# Patient Record
Sex: Female | Born: 1990 | Race: White | Hispanic: No | Marital: Single | Smoking: Never smoker
Health system: Southern US, Community
[De-identification: ages and names within clinical notes are randomized; demographics above are authoritative.]

## PROBLEM LIST (undated history)

## (undated) DIAGNOSIS — J45909 Unspecified asthma, uncomplicated: Secondary | ICD-10-CM

---

## 2012-02-14 ENCOUNTER — Inpatient Hospital Stay (HOSPITAL_COMMUNITY): Payer: BC Managed Care – PPO

## 2012-02-14 ENCOUNTER — Emergency Department (HOSPITAL_COMMUNITY): Payer: BC Managed Care – PPO

## 2012-02-14 ENCOUNTER — Encounter (HOSPITAL_COMMUNITY): Payer: Self-pay

## 2012-02-14 ENCOUNTER — Inpatient Hospital Stay (HOSPITAL_COMMUNITY)
Admission: EM | Admit: 2012-02-14 | Discharge: 2012-02-23 | DRG: 882 | Disposition: A | Discharge status: Home / Self Care | Payer: BC Managed Care – PPO | Attending: Emergency Medicine | Admitting: Emergency Medicine

## 2012-02-14 DIAGNOSIS — H5702 Anisocoria: Secondary | ICD-10-CM | POA: Diagnosis present

## 2012-02-14 DIAGNOSIS — T380X5A Adverse effect of glucocorticoids and synthetic analogues, initial encounter: Secondary | ICD-10-CM | POA: Diagnosis present

## 2012-02-14 DIAGNOSIS — J9600 Acute respiratory failure, unspecified whether with hypoxia or hypercapnia: Secondary | ICD-10-CM | POA: Diagnosis present

## 2012-02-14 DIAGNOSIS — R7309 Other abnormal glucose: Secondary | ICD-10-CM | POA: Diagnosis present

## 2012-02-14 DIAGNOSIS — J9383 Other pneumothorax: Secondary | ICD-10-CM

## 2012-02-14 DIAGNOSIS — T17408A Unspecified foreign body in trachea causing other injury, initial encounter: Secondary | ICD-10-CM | POA: Diagnosis present

## 2012-02-14 DIAGNOSIS — I959 Hypotension, unspecified: Secondary | ICD-10-CM | POA: Diagnosis present

## 2012-02-14 DIAGNOSIS — G936 Cerebral edema: Secondary | ICD-10-CM | POA: Diagnosis present

## 2012-02-14 DIAGNOSIS — G9340 Encephalopathy, unspecified: Secondary | ICD-10-CM | POA: Diagnosis present

## 2012-02-14 DIAGNOSIS — J9602 Acute respiratory failure with hypercapnia: Secondary | ICD-10-CM | POA: Diagnosis present

## 2012-02-14 DIAGNOSIS — IMO0002 Reserved for concepts with insufficient information to code with codable children: Secondary | ICD-10-CM | POA: Diagnosis present

## 2012-02-14 DIAGNOSIS — J930 Spontaneous tension pneumothorax: Secondary | ICD-10-CM | POA: Diagnosis present

## 2012-02-14 DIAGNOSIS — J811 Chronic pulmonary edema: Secondary | ICD-10-CM | POA: Diagnosis present

## 2012-02-14 DIAGNOSIS — F410 Panic disorder [episodic paroxysmal anxiety] without agoraphobia: Secondary | ICD-10-CM | POA: Diagnosis present

## 2012-02-14 DIAGNOSIS — J45902 Unspecified asthma with status asthmaticus: Principal | ICD-10-CM | POA: Diagnosis present

## 2012-02-14 DIAGNOSIS — G9349 Other encephalopathy: Secondary | ICD-10-CM | POA: Diagnosis present

## 2012-02-14 DIAGNOSIS — R739 Hyperglycemia, unspecified: Secondary | ICD-10-CM | POA: Diagnosis present

## 2012-02-14 DIAGNOSIS — E875 Hyperkalemia: Secondary | ICD-10-CM | POA: Diagnosis not present

## 2012-02-14 HISTORY — DX: Unspecified asthma, uncomplicated: J45.909

## 2012-02-14 LAB — URINALYSIS, ROUTINE W REFLEX MICROSCOPIC
Bilirubin Urine: NEGATIVE
Glucose, UA: 250 mg/dL — AB
Ketones, ur: NEGATIVE mg/dL
Specific Gravity, Urine: 1.021 (ref 1.005–1.030)
pH: 5 (ref 5.0–8.0)

## 2012-02-14 LAB — RAPID URINE DRUG SCREEN, HOSP PERFORMED
Amphetamines: NOT DETECTED
Barbiturates: NOT DETECTED
Benzodiazepines: POSITIVE — AB
Cocaine: NOT DETECTED

## 2012-02-14 LAB — BASIC METABOLIC PANEL
BUN: 11 mg/dL (ref 6–23)
BUN: 12 mg/dL (ref 6–23)
CO2: 24 mEq/L (ref 19–32)
CO2: 34 mEq/L — ABNORMAL HIGH (ref 19–32)
Calcium: 9.4 mg/dL (ref 8.4–10.5)
Calcium: 8.3 mg/dL — ABNORMAL LOW (ref 8.4–10.5)
Chloride: 109 mEq/L (ref 96–112)
Creatinine, Ser: 0.7 mg/dL (ref 0.50–1.10)
Creatinine, Ser: 1.18 mg/dL — ABNORMAL HIGH (ref 0.50–1.10)
Creatinine, Ser: 1.35 mg/dL — ABNORMAL HIGH (ref 0.50–1.10)
GFR calc Af Amer: 76 mL/min — ABNORMAL LOW (ref >90)
GFR calc Af Amer: 65 mL/min — ABNORMAL LOW (ref >90)
GFR calc non Af Amer: >90 mL/min (ref >90)
GFR calc non Af Amer: 56 mL/min — ABNORMAL LOW (ref >90)
Glucose, Bld: 215 mg/dL — ABNORMAL HIGH (ref 70–99)
Glucose, Bld: 261 mg/dL — ABNORMAL HIGH (ref 70–99)
Sodium: 139 mEq/L (ref 135–145)
Sodium: 143 mEq/L (ref 135–145)

## 2012-02-14 LAB — CBC WITH DIFFERENTIAL/PLATELET
Basophils Absolute: 0.2 10*3/uL — ABNORMAL HIGH (ref 0.0–0.1)
Basophils Relative: 1 % (ref 0–1)
Eosinophils Absolute: 0.9 10*3/uL — ABNORMAL HIGH (ref 0.0–0.7)
Hemoglobin: 13.9 g/dL (ref 12.0–15.0)
Lymphocytes Relative: 33 % (ref 12–46)
MCH: 28.7 pg (ref 26.0–34.0)
MCHC: 33.5 g/dL (ref 30.0–36.0)
Monocytes Absolute: 1.6 10*3/uL — ABNORMAL HIGH (ref 0.1–1.0)
Neutro Abs: 12.4 10*3/uL — ABNORMAL HIGH (ref 1.7–7.7)
Neutrophils Relative %: 55 % (ref 43–77)
RDW: 12.8 % (ref 11.5–15.5)

## 2012-02-14 LAB — GLUCOSE, CAPILLARY
Glucose-Capillary: 143 mg/dL — ABNORMAL HIGH (ref 70–99)
Glucose-Capillary: 106 mg/dL — ABNORMAL HIGH (ref 70–99)

## 2012-02-14 LAB — BLOOD GAS, ARTERIAL
Acid-base deficit: 2.1 mmol/L — ABNORMAL HIGH (ref 0.0–2.0)
Bicarbonate: 27.8 mEq/L — ABNORMAL HIGH (ref 20.0–24.0)
FIO2: 0.7 %
O2 Saturation: 97.3 %
PEEP: 8 cmH2O
TCO2: 25.5 mmol/L (ref 0–100)
pCO2 arterial: 72.1 mmHg (ref 35.0–45.0)
pO2, Arterial: 84 mmHg (ref 80.0–100.0)

## 2012-02-14 LAB — URINE MICROSCOPIC-ADD ON

## 2012-02-14 LAB — PRO B NATRIURETIC PEPTIDE: Pro B Natriuretic peptide (BNP): 40.4 pg/mL (ref 0–125)

## 2012-02-14 LAB — PREGNANCY, URINE: Preg Test, Ur: NEGATIVE

## 2012-02-14 MED ORDER — INSULIN ASPART 100 UNIT/ML ~~LOC~~ SOLN
0.0000 [IU] | SUBCUTANEOUS | Status: DC
  Administered 2012-02-14 – 2012-02-15 (×5): 3 [IU] via SUBCUTANEOUS

## 2012-02-14 MED ORDER — INSULIN ASPART 100 UNIT/ML ~~LOC~~ SOLN
10.0000 [IU] | Freq: Once | SUBCUTANEOUS | Status: DC
  Filled 2012-02-14: qty 3

## 2012-02-14 MED ORDER — SODIUM CHLORIDE 0.9 % IV BOLUS (SEPSIS)
1000.0000 mL | Freq: Once | INTRAVENOUS | Status: AC
  Administered 2012-02-14: 1000 mL via INTRAVENOUS

## 2012-02-14 MED ORDER — LORAZEPAM 2 MG/ML IJ SOLN
4.0000 mg | Freq: Once | INTRAMUSCULAR | Status: AC
  Administered 2012-02-14: 4 mg via INTRAVENOUS
  Filled 2012-02-14: qty 2

## 2012-02-14 MED ORDER — PROPOFOL 10 MG/ML IV EMUL
5.0000 ug/kg/min | INTRAVENOUS | Status: DC
  Administered 2012-02-14: 35 ug/kg/min via INTRAVENOUS
  Administered 2012-02-15 (×4): 40 ug/kg/min via INTRAVENOUS
  Administered 2012-02-16: 35 ug/kg/min via INTRAVENOUS
  Administered 2012-02-16 (×2): 40 ug/kg/min via INTRAVENOUS
  Administered 2012-02-16: 35 ug/kg/min via INTRAVENOUS
  Administered 2012-02-17: 45 ug/kg/min via INTRAVENOUS
  Administered 2012-02-17: 35 ug/kg/min via INTRAVENOUS
  Administered 2012-02-17 (×2): 45 ug/kg/min via INTRAVENOUS
  Administered 2012-02-17: 35 ug/kg/min via INTRAVENOUS
  Filled 2012-02-14 (×22): qty 100

## 2012-02-14 MED ORDER — ALBUTEROL SULFATE (5 MG/ML) 0.5% IN NEBU
INHALATION_SOLUTION | RESPIRATORY_TRACT | Status: AC
  Filled 2012-02-14: qty 2

## 2012-02-14 MED ORDER — ROCURONIUM BROMIDE 50 MG/5ML IV SOLN
INTRAVENOUS | Status: AC
  Filled 2012-02-14: qty 2

## 2012-02-14 MED ORDER — LORAZEPAM 2 MG/ML IJ SOLN
4.0000 mg | Freq: Once | INTRAMUSCULAR | Status: AC
  Administered 2012-02-14: 4 mg via INTRAVENOUS

## 2012-02-14 MED ORDER — ARTIFICIAL TEARS OP OINT
TOPICAL_OINTMENT | Freq: Three times a day (TID) | OPHTHALMIC | Status: DC
  Administered 2012-02-15 – 2012-02-16 (×5): via OPHTHALMIC
  Filled 2012-02-14: qty 3.5

## 2012-02-14 MED ORDER — ETOMIDATE 2 MG/ML IV SOLN
INTRAVENOUS | Status: AC
  Administered 2012-02-14: 20 mg via INTRAVENOUS
  Filled 2012-02-14: qty 20

## 2012-02-14 MED ORDER — PROPOFOL 10 MG/ML IV BOLUS
INTRAVENOUS | Status: AC
  Filled 2012-02-14: qty 1

## 2012-02-14 MED ORDER — DEXTROSE 5 % IV SOLN
1.0000 g | Freq: Once | INTRAVENOUS | Status: AC
  Administered 2012-02-14: 1 g via INTRAVENOUS
  Filled 2012-02-14: qty 10

## 2012-02-14 MED ORDER — BIOTENE DRY MOUTH MT LIQD
15.0000 mL | Freq: Four times a day (QID) | OROMUCOSAL | Status: DC
  Administered 2012-02-15 – 2012-02-19 (×19): 15 mL via OROMUCOSAL

## 2012-02-14 MED ORDER — NOREPINEPHRINE BITARTRATE 1 MG/ML IJ SOLN
2.0000 ug/min | INTRAVENOUS | Status: DC
  Administered 2012-02-14: 5 ug/min via INTRAVENOUS
  Filled 2012-02-14: qty 4

## 2012-02-14 MED ORDER — IPRATROPIUM BROMIDE HFA 17 MCG/ACT IN AERS
6.0000 | INHALATION_SPRAY | Freq: Four times a day (QID) | RESPIRATORY_TRACT | Status: DC | PRN
  Administered 2012-02-14: 6 via RESPIRATORY_TRACT
  Filled 2012-02-14: qty 12.9

## 2012-02-14 MED ORDER — PHENYLEPHRINE HCL 10 MG/ML IJ SOLN
30.0000 ug/min | INTRAMUSCULAR | Status: DC
  Administered 2012-02-14: 20 ug/min via INTRAVENOUS
  Filled 2012-02-14: qty 1

## 2012-02-14 MED ORDER — FLUTICASONE PROPIONATE HFA 220 MCG/ACT IN AERO
2.0000 | INHALATION_SPRAY | Freq: Two times a day (BID) | RESPIRATORY_TRACT | Status: DC
  Administered 2012-02-14 – 2012-02-15 (×2): 2 via RESPIRATORY_TRACT
  Filled 2012-02-14: qty 12

## 2012-02-14 MED ORDER — DOXYCYCLINE HYCLATE 100 MG IV SOLR
100.0000 mg | Freq: Once | INTRAVENOUS | Status: AC
  Administered 2012-02-14: 100 mg via INTRAVENOUS
  Filled 2012-02-14: qty 100

## 2012-02-14 MED ORDER — DEXTROSE 50 % IV SOLN
INTRAVENOUS | Status: AC
  Administered 2012-02-14: 50 mL
  Filled 2012-02-14: qty 50

## 2012-02-14 MED ORDER — TERBUTALINE SULFATE 1 MG/ML IJ SOLN
0.2500 mg | Freq: Once | INTRAMUSCULAR | Status: AC
  Administered 2012-02-14: 0.25 mg via SUBCUTANEOUS
  Filled 2012-02-14: qty 1

## 2012-02-14 MED ORDER — INSULIN ASPART 100 UNIT/ML IV SOLN
10.0000 [IU] | Freq: Once | INTRAVENOUS | Status: AC
  Administered 2012-02-14: 10 [IU] via INTRAVENOUS

## 2012-02-14 MED ORDER — MAGNESIUM SULFATE 40 MG/ML IJ SOLN
2.0000 g | Freq: Once | INTRAMUSCULAR | Status: AC
  Administered 2012-02-14: 2 g via INTRAVENOUS
  Filled 2012-02-14: qty 50

## 2012-02-14 MED ORDER — FENTANYL CITRATE 0.05 MG/ML IJ SOLN
25.0000 ug | Freq: Once | INTRAMUSCULAR | Status: AC
  Administered 2012-02-14 (×2): 25 ug via INTRAVENOUS

## 2012-02-14 MED ORDER — SODIUM CHLORIDE 0.9 % IV SOLN
100.0000 ug/h | INTRAVENOUS | Status: DC
  Administered 2012-02-14: 50 ug/h via INTRAVENOUS
  Administered 2012-02-16: 100 ug/h via INTRAVENOUS
  Filled 2012-02-14 (×3): qty 50

## 2012-02-14 MED ORDER — LEVALBUTEROL TARTRATE 45 MCG/ACT IN AERO
4.0000 | INHALATION_SPRAY | RESPIRATORY_TRACT | Status: DC | PRN
  Administered 2012-02-14 (×3): 4 via RESPIRATORY_TRACT
  Filled 2012-02-14: qty 15

## 2012-02-14 MED ORDER — SODIUM CHLORIDE 0.9 % IV SOLN
INTRAVENOUS | Status: AC
  Filled 2012-02-14: qty 10

## 2012-02-14 MED ORDER — LIDOCAINE HCL (CARDIAC) 20 MG/ML IV SOLN
INTRAVENOUS | Status: AC
  Filled 2012-02-14: qty 5

## 2012-02-14 MED ORDER — BUDESONIDE 0.25 MG/2ML IN SUSP
0.2500 mg | Freq: Four times a day (QID) | RESPIRATORY_TRACT | Status: DC
  Filled 2012-02-14 (×4): qty 2

## 2012-02-14 MED ORDER — SUCCINYLCHOLINE CHLORIDE 20 MG/ML IJ SOLN
INTRAMUSCULAR | Status: AC
  Administered 2012-02-14: 100 mg via INTRAVENOUS
  Filled 2012-02-14: qty 5

## 2012-02-14 MED ORDER — FENTANYL CITRATE 0.05 MG/ML IJ SOLN
INTRAMUSCULAR | Status: AC
  Administered 2012-02-14: 25 ug via INTRAVENOUS
  Filled 2012-02-14: qty 2

## 2012-02-14 MED ORDER — SODIUM CHLORIDE 3 % IV SOLN
INTRAVENOUS | Status: DC
  Administered 2012-02-14 – 2012-02-15 (×3): via INTRAVENOUS
  Filled 2012-02-14 (×4): qty 500

## 2012-02-14 MED ORDER — ALBUTEROL (5 MG/ML) CONTINUOUS INHALATION SOLN
10.0000 mg/h | INHALATION_SOLUTION | Freq: Once | RESPIRATORY_TRACT | Status: AC
  Administered 2012-02-14: 10 mg/h via RESPIRATORY_TRACT

## 2012-02-14 MED ORDER — SODIUM POLYSTYRENE SULFONATE 15 GM/60ML PO SUSP
30.0000 g | Freq: Once | ORAL | Status: AC
  Administered 2012-02-14: 30 g
  Filled 2012-02-14: qty 120

## 2012-02-14 MED ORDER — FAMOTIDINE IN NACL 20-0.9 MG/50ML-% IV SOLN
20.0000 mg | Freq: Two times a day (BID) | INTRAVENOUS | Status: DC
  Administered 2012-02-14 – 2012-02-18 (×9): 20 mg via INTRAVENOUS
  Filled 2012-02-14 (×10): qty 50

## 2012-02-14 MED ORDER — IPRATROPIUM BROMIDE 0.02 % IN SOLN
0.5000 mg | Freq: Once | RESPIRATORY_TRACT | Status: AC
  Administered 2012-02-14: 0.5 mg via RESPIRATORY_TRACT

## 2012-02-14 MED ORDER — BUDESONIDE 0.25 MG/2ML IN SUSP
0.2500 mg | Freq: Four times a day (QID) | RESPIRATORY_TRACT | Status: DC
  Administered 2012-02-14: 0.25 mg via RESPIRATORY_TRACT
  Filled 2012-02-14 (×4): qty 2

## 2012-02-14 MED ORDER — DEXTROSE 50 % IV SOLN
50.0000 mL | Freq: Once | INTRAVENOUS | Status: AC
  Administered 2012-02-14: 50 mL via INTRAVENOUS
  Filled 2012-02-14: qty 50

## 2012-02-14 MED ORDER — SODIUM CHLORIDE 0.9 % IV SOLN
250.0000 mL | INTRAVENOUS | Status: DC | PRN
  Administered 2012-02-18 – 2012-02-22 (×3): 250 mL via INTRAVENOUS

## 2012-02-14 MED ORDER — METHYLPREDNISOLONE SODIUM SUCC 125 MG IJ SOLR
125.0000 mg | Freq: Four times a day (QID) | INTRAMUSCULAR | Status: DC
  Administered 2012-02-14 – 2012-02-16 (×7): 125 mg via INTRAVENOUS
  Filled 2012-02-14 (×12): qty 2

## 2012-02-14 MED ORDER — CHLORHEXIDINE GLUCONATE 0.12 % MT SOLN
OROMUCOSAL | Status: AC
  Administered 2012-02-14: 15 mL via OROMUCOSAL
  Filled 2012-02-14: qty 15

## 2012-02-14 MED ORDER — SODIUM BICARBONATE 8.4 % IV SOLN
INTRAVENOUS | Status: AC
  Administered 2012-02-14: 50 meq
  Filled 2012-02-14: qty 50

## 2012-02-14 MED ORDER — KETAMINE HCL 10 MG/ML IJ SOLN: 1.5000 mg/kg | Freq: Once | INTRAMUSCULAR | Status: DC

## 2012-02-14 MED ORDER — KETAMINE HCL 10 MG/ML IJ SOLN
40.0000 mg | Freq: Once | INTRAMUSCULAR | Status: AC
  Administered 2012-02-14: 40 mg via INTRAVENOUS

## 2012-02-14 MED ORDER — CHLORHEXIDINE GLUCONATE 0.12 % MT SOLN
15.0000 mL | Freq: Two times a day (BID) | OROMUCOSAL | Status: DC
  Administered 2012-02-14 – 2012-02-19 (×10): 15 mL via OROMUCOSAL
  Filled 2012-02-14 (×10): qty 15

## 2012-02-14 MED ORDER — SODIUM CHLORIDE 0.9 % IV SOLN
2.0000 mg/h | INTRAVENOUS | Status: DC
  Administered 2012-02-14: 2 mg/h via INTRAVENOUS
  Filled 2012-02-14: qty 10

## 2012-02-14 MED ORDER — SODIUM BICARBONATE 8.4 % IV SOLN
INTRAVENOUS | Status: DC
  Administered 2012-02-14 (×2): via INTRAVENOUS
  Filled 2012-02-14 (×5): qty 150

## 2012-02-14 MED ORDER — LEVALBUTEROL TARTRATE 45 MCG/ACT IN AERO: 8.0000 | INHALATION_SPRAY | Freq: Four times a day (QID) | RESPIRATORY_TRACT | Status: DC

## 2012-02-14 MED ORDER — LEVALBUTEROL HCL 1.25 MG/0.5ML IN NEBU
1.2500 mg | INHALATION_SOLUTION | Freq: Four times a day (QID) | RESPIRATORY_TRACT | Status: DC
  Filled 2012-02-14 (×3): qty 6
  Filled 2012-02-14: qty 0.5
  Filled 2012-02-14: qty 6

## 2012-02-14 MED ORDER — IPRATROPIUM BROMIDE HFA 17 MCG/ACT IN AERS: 4.0000 | INHALATION_SPRAY | Freq: Four times a day (QID) | RESPIRATORY_TRACT | Status: DC

## 2012-02-14 MED ORDER — ALBUTEROL SULFATE (5 MG/ML) 0.5% IN NEBU
2.5000 mg | INHALATION_SOLUTION | Freq: Once | RESPIRATORY_TRACT | Status: AC
  Administered 2012-02-14: 2.5 mg via RESPIRATORY_TRACT

## 2012-02-14 MED ORDER — IPRATROPIUM BROMIDE 0.02 % IN SOLN
0.5000 mg | Freq: Four times a day (QID) | RESPIRATORY_TRACT | Status: DC
  Administered 2012-02-14: 0.5 mg via RESPIRATORY_TRACT
  Filled 2012-02-14: qty 2.5

## 2012-02-14 MED ORDER — KCL IN DEXTROSE-NACL 20-5-0.45 MEQ/L-%-% IV SOLN: INTRAVENOUS | Status: DC

## 2012-02-14 MED ORDER — SODIUM CHLORIDE 0.9 % IV SOLN
0.5000 ug/kg/min | INTRAVENOUS | Status: DC
  Administered 2012-02-14: 2.8 ug/kg/min via INTRAVENOUS
  Administered 2012-02-16: 2 ug/kg/min via INTRAVENOUS
  Filled 2012-02-14 (×3): qty 20

## 2012-02-14 MED ORDER — LORAZEPAM 2 MG/ML IJ SOLN
INTRAMUSCULAR | Status: AC
  Administered 2012-02-14: 4 mg via INTRAVENOUS
  Filled 2012-02-14: qty 2

## 2012-02-14 MED ORDER — LEVALBUTEROL TARTRATE 45 MCG/ACT IN AERO
8.0000 | INHALATION_SPRAY | RESPIRATORY_TRACT | Status: DC
  Administered 2012-02-14 – 2012-02-18 (×43): 8 via RESPIRATORY_TRACT
  Filled 2012-02-14 (×2): qty 15

## 2012-02-14 MED ORDER — SODIUM CHLORIDE 0.9 % IV SOLN
1.0000 g | Freq: Once | INTRAVENOUS | Status: AC
  Administered 2012-02-14: 1 g via INTRAVENOUS
  Filled 2012-02-14: qty 10

## 2012-02-14 MED ORDER — HEPARIN SODIUM (PORCINE) 5000 UNIT/ML IJ SOLN
5000.0000 [IU] | Freq: Three times a day (TID) | INTRAMUSCULAR | Status: DC
  Administered 2012-02-14 – 2012-02-23 (×27): 5000 [IU] via SUBCUTANEOUS
  Filled 2012-02-14 (×30): qty 1

## 2012-02-14 MED ORDER — IPRATROPIUM BROMIDE HFA 17 MCG/ACT IN AERS
6.0000 | INHALATION_SPRAY | RESPIRATORY_TRACT | Status: DC | PRN
  Administered 2012-02-14: 6 via RESPIRATORY_TRACT
  Filled 2012-02-14: qty 12.9

## 2012-02-14 MED ORDER — LEVALBUTEROL HCL 0.63 MG/3ML IN NEBU
0.6300 mg | INHALATION_SOLUTION | RESPIRATORY_TRACT | Status: DC | PRN
  Filled 2012-02-14 (×2): qty 3

## 2012-02-14 MED ORDER — SODIUM CHLORIDE 0.9 % IV SOLN
Freq: Once | INTRAVENOUS | Status: AC
  Administered 2012-02-14: 09:00:00 via INTRAVENOUS

## 2012-02-14 MED FILL — Medication: Qty: 1 | Status: AC

## 2012-02-14 NOTE — ED Notes (Signed)
Per EMS pt c/o asthma attack, pt from out of town, used inhaler but doesn't have her normal meds, pt received 2 a/a tx enroute, pt went unresponsive enroute, ems attempted to intubate with no success, pt had gag reflex. EDP at bedside

## 2012-02-14 NOTE — ED Notes (Signed)
Report given-transfer to 1233

## 2012-02-14 NOTE — Procedures (Signed)
Chest Tube Insertion Procedure Note  Indications:  Clinically significant Pneumothorax  Pre-operative Diagnosis: Pneumothorax  Post-operative Diagnosis: Pneumothorax  Procedure Details  Informed consent was obtained for the procedure, including sedation.  Risks of lung perforation, hemorrhage, arrhythmia, and adverse drug reaction were discussed.   After sterile skin prep, using standard technique, a 28 French tube was placed in the left anterior 4th  rib space.  Findings: Rush of air emitted  Estimated Blood Loss:  Minimal         Specimens:  None              Complications:  None; patient tolerated the procedure well.         Disposition: ICU - intubated and critically ill.         Condition: unstable  Attending Attestation: I performed the procedure.  Dorcas Carrow Beeper  [phone]  Cell  [phone]  If no response or cell goes to voicemail, call beeper [phone]

## 2012-02-14 NOTE — Progress Notes (Signed)
[id number]/Chrishawn Kring, RN, BSN, CCM: CHART REVIEWED AND UPDATED. Next chart review due on 1207013. NO DISCHARGE NEEDS PRESENT AT THIS TIME. CASE MANAGEMENT [phone] 

## 2012-02-14 NOTE — Progress Notes (Signed)
eLink Physician-Brief Progress Note Patient Name: Nyiesha Beever DOB: 01/17/1991 MRN: [id number]  Date of Service  02/14/2012   HPI/Events of Note   Vt at 600cc, PCV with PC 60 and PEEP 5  eICU Interventions  ABG now with goal to decrease PC if acidosis will allow.    Intervention Category Major Interventions: Respiratory failure - evaluation and management  Yania Bogie S. 02/14/2012, 11:15 PM

## 2012-02-14 NOTE — ED Notes (Signed)
Pt intubated successfully 7.0 @ 22 by Dr. Rhunette Croft at [phone], Respiratory at bedside, confirmed by CO2 detector. Portable cxray done.

## 2012-02-14 NOTE — ED Provider Notes (Addendum)
History     CSN: [id number]  Arrival date & time 02/14/12  4098   First MD Initiated Contact with Patient 02/14/12 0809      Chief Complaint  Patient presents with  . Respiratory Distress    (Consider location/radiation/quality/duration/timing/severity/associated sxs/prior treatment) HPI Comments: Level 5 caveat - unstable vital signs, altered mental status.  Patient with hx of asthma comes in with cc of respiratory distress. Per EMS, patient was given solumedrol, albuterol en route, and she became unconscious. Patient is a Consulting civil engineer in [employer], and she knocked on the door of one of her friends complaining of DIB. I spoke with mother recently, and she reports that patient has a severe asthma, and that she has been intubated once before 2 years back.  Pt arrives with a GCS (e/v/m = 4, 3, 6) 13. She was slightly combative and diaphoretic, with pupils at 4 mm We started bipap, given terbutaline, but she stopped following commands, and ABG showed severe acidosis - so decision to intubate was made.  The history is provided by a relative, a friend and the EMS personnel.    Past Medical History  Diagnosis Date  . Asthma     History reviewed. No pertinent past surgical history.  No family history on file.  History  Substance Use Topics  . Smoking status: Not on file  . Smokeless tobacco: Not on file  . Alcohol Use: No    OB History    Grav Para Term Preterm Abortions TAB SAB Ect Mult Living                  Review of Systems  Unable to perform ROS: Unstable vital signs    Allergies  Azithromycin  Home Medications  No current outpatient prescriptions on file.  BP 190/110  Pulse 55  Resp 27  Wt 209 lb (94.802 kg)  SpO2 67%  Physical Exam  Nursing note and vitals reviewed. Constitutional: She appears well-developed.  HENT:  Head: Normocephalic.  Eyes: Conjunctivae normal are normal. Pupils are equal, round, and reactive to light.  Cardiovascular: Regular rhythm.         tachycardia  Pulmonary/Chest: No stridor. She is in respiratory distress. She has wheezes.       Pt has respiratory distress, breathing over 30, unable to complete full sentences, and using accessory muscles. Pt also noted to have abd retractions with diffuse wheezing and poor air entry.  Abdominal: Soft.  Musculoskeletal: She exhibits no edema.  Neurological:       gcs - 13 initially, dropped to 4/1/1 = 6   Skin: Skin is warm.       diophoretic    ED Course  OG placement Date/Time: 02/14/2012 9:00 AM Performed by: Derwood Kaplan Authorized by: Derwood Kaplan Consent: Verbal consent not obtained. The procedure was performed in an emergent situation. Local anesthesia used: no Patient sedated: no Patient tolerance: Patient tolerated the procedure well with no immediate complications.   (including critical care time)   Labs Reviewed  BLOOD GAS, ARTERIAL  URINALYSIS, ROUTINE W REFLEX MICROSCOPIC  URINE RAPID DRUG SCREEN (HOSP PERFORMED)  PREGNANCY, URINE  CBC WITH DIFFERENTIAL  BASIC METABOLIC PANEL  TROPONIN I   Dg Chest Portable 1 View  02/14/2012  *RADIOLOGY REPORT*  Clinical Data: Respiratory distress, altered level of consciousness, intubation  PORTABLE CHEST - 1 VIEW  Comparison: Portable exam 0809 hours without priors for comparison.  Findings: Tip of endotracheal tube 2.5 cm above carina. Nasogastric tube coiled in stomach.  Upper normal heart size. Normal mediastinal contours and pulmonary vascularity. Bilateral upper lobe volume loss and slight superior retraction of the hila. Abnormal density in the medial left upper lobe question atelectasis versus infiltrate. Vague opacity at medial right apex. Remaining lungs well expanded and clear. No pleural effusion or pneumothorax.  IMPRESSION: Satisfactory endotracheal tube position. Abnormal left upper lobe and right apical densities, question atelectasis versus infiltrate in the left upper lobe. Right apical changes could  potentially represent scarring, but underlying mass lesions in the upper lobes are not excluded. Radiographic follow-up until resolution recommended. If these changes fail to resolve, CT chest with contrast would be warranted for further evaluation.   Original Report Authenticated By: Ulyses Southward, M.D.      No diagnosis found.    MDM   Date: 02/14/2012  Rate: 74  Rhythm: normal sinus rhythm  QRS Axis: normal  Intervals: normal  ST/T Wave abnormalities: nonspecific T wave changes  Conduction Disutrbances:none  Narrative Interpretation:   Old EKG Reviewed: none available  Pt comes in respiratory failure. Hx of asthma per friend. Started on bipap immediately, and terb given. Exam consistent with asthma/copd exacerbation.  Pt's mentation declined despite being started on bipap, and we intubated her. Pt's friend informed. Spoke with the mother over the phone. Mother reports previous intubation 2 years back - likely from asthma related problems. CCM called.  INTUBATION Performed by: Derwood Kaplan  Required items: required blood products, implants, devices, and special equipment available Patient identity confirmed: provided demographic data and hospital-assigned identification number Time out: Immediately prior to procedure a "time out" was called to verify the correct patient, procedure, equipment, support staff and site/side marked as required.  Indications: Respiratory failure  Intubation method: Direct Laryngoscopy   Preoxygenation: Bipap  Sedatives: Etomidate 20 mg Paralytic: Succinylcholine 100 mg  Tube Size: 7 cuffed  Post-procedure assessment: chest rise and ETCO2 monitor Breath sounds: equal and absent over the epigastrium Tube secured with: ETT holder Chest x-ray interpreted by radiologist and me.  Chest x-ray findings: endotracheal tube in appropriate position  Patient tolerated the procedure well with no immediate complications.  CRITICAL CARE Performed  by: Derwood Kaplan   Total critical care time: 45 minutes  Critical care time was exclusive of separately billable procedures and treating other patients.  Critical care was necessary to treat or prevent imminent or life-threatening deterioration.  Critical care was time spent personally by me on the following activities: development of treatment plan with patient and/or surrogate as well as nursing, discussions with consultants, evaluation of patient's response to treatment, examination of patient, obtaining history from patient or surrogate, ordering and performing treatments and interventions, ordering and review of laboratory studies, ordering and review of radiographic studies, pulse oximetry and re-evaluation of patient's condition.   Derwood Kaplan, MD 02/14/12 1610   Derwood Kaplan, MD 02/14/12 [phone]

## 2012-02-14 NOTE — Progress Notes (Signed)
Chaplain met with pt's mother and family at bedside.  Introduced spiritual care as resource and provided support around pt's hospitalization and empathic presence.   At time of chaplain encounter, pt's mother had not yet spoken with critical care and was anxious to receive information about pt's condition.    Belva Crome  MDiv, Chaplain

## 2012-02-14 NOTE — Procedures (Signed)
PROCEDURE NOTE: [place] CVL PLACEMENT  INDICATION:    Monitoring of central venous pressures and/or administration of medications optimally administered in central vein  CONSENT:   Risks of procedure as well as the alternatives were explained to the patient or surrogate. Consent for procedure obtained. A time out was performed to review patient identification, procedure to be performed, correct patient position, medications/allergies/relevent history, required imaging and test results.  PROCEDURE  Maximum sterile technique was used including antiseptics, cap, gloves, gown, hand hygiene, mask and sheet.  Skin prep: Chlorhexidine; local anesthetic administered  A antimicrobial bonded/coated triple lumen catheter was placed in the R [place] vein using the Seldinger technique.    EVALUATION:  Blood flow good  Complications: No apparent complications  Patient tolerated the procedure well.  Chest X-ray confirmed proper position and no PTX   Billy Fischer, MD [facility] [phone]

## 2012-02-14 NOTE — ED Notes (Signed)
CBG 197. 

## 2012-02-14 NOTE — Procedures (Signed)
Chest Tube Insertion Procedure Note  Indications:  Clinically significant Pneumothorax  Pre-operative Diagnosis: Pneumothorax  Post-operative Diagnosis: Pneumothorax  Procedure Details  Informed consent was obtained for the procedure, including sedation.  Risks of lung perforation, hemorrhage, arrhythmia, and adverse drug reaction were discussed.   After sterile skin prep, using standard technique, a 28 French tube was placed in the right anterior 4th rib space.  Findings: Rush of air emitted   Estimated Blood Loss:  less than 50 mL         Specimens:  None              Complications:  None; patient tolerated the procedure well.         Disposition: ICU - intubated and critically ill.         Condition: unstable  Attending Attestation: I performed the procedure.   Dorcas Carrow Beeper  [phone]  Cell  [phone]  If no response or cell goes to voicemail, call beeper [phone]

## 2012-02-14 NOTE — Progress Notes (Signed)
eLink Physician-Brief Progress Note Patient Name: Crystall Donaldson DOB: Nov 01, 1990 MRN: [id number]  Date of Service  02/14/2012   HPI/Events of Note    Lab 02/14/12 2321 02/14/12 2136 02/14/12 1949 02/14/12 1600 02/14/12 1110 02/14/12 0730  PHART 7.209* 7.062* 6.871* 6.812* PENDING --  PCO2ART 72.1* -- -- NO RESULTS GIVEN OUT OF REPORTABLE RANGE PENDING PENDING  PO2ART 84.0 265.0* 159.0* 98.8 123.0* --  HCO3 27.8* -- -- NO RESULTS GIVEN OUT OF REPORTABLE RANGE PENDING PENDING  TCO2 25.5 -- -- NO RESULTS GIVEN OUT OF REPORTABLE RANGE PENDING PENDING  O2SAT 97.3 99.3 98.3 93.8 93.7 --     eICU Interventions  Vent adjusted to maintain Ve > 15L/min >> PCV decreased to 50, PEEP to 5, FiO2 to 0.60. Goal decrease RR if pH trend continues to improve   Intervention Category Major Interventions: Respiratory failure - evaluation and management  Keilan Nichol S. 02/14/2012, 11:46 PM

## 2012-02-14 NOTE — Progress Notes (Signed)
PCCM:  I was called stat to the bedside at 6pm to assess this 21yo F with status asthmaticus and resp failure with bilateral PTX and unable to ventilate the pt.  CXR with progressive PTX seen.  I did the following :  1. Gave 40mg  KEtamine 2. D/c atrovent Increased xopenex RX 3. GAve NS volume, started pressors, gave Ca, N[place]3, started kayexalate 4. Placed two chest tubes into anterior apical space.  L remained at apex, R CT migrated to the base. 5. FOBperformed and thick secretions removed from both lungs   Pt changed to PCV ventilation.  K still > 7.5 ABG is pending.  Pt remains critically ill. I am very concerned re: CNS status  Still requiring high PIP to ventilate.  CC time 1.5 hours Dorcas Carrow Beeper  [phone]  Cell  [phone]  If no response or cell goes to voicemail, call beeper [phone]

## 2012-02-14 NOTE — Progress Notes (Signed)
Spoke with Marquette Saa on phone. He called my office to give me patient and family information. I took this information to the ED nurse. He is evidently a long time family friend. He told me at 9:00 the mother and her sister, the patient's aunt, left to come to [place]. It is a five hour drive. Will support family when they arrive.

## 2012-02-14 NOTE — Consult Note (Addendum)
NEURO HOSPITALIST CONSULT NOTE    Reason for Consult:Questionable intracerebral edema  HPI:                                                                                                                                         HPI obtained by chart--patient is currently being intubated.  Alexandra Thornton is an 21 y.o. female who was brought to [facility] ED for status asthmaticus.  Patient apperantly awoke this AM SOB and called a friend who then called EMS.  En route patient was given solumedrol and albuterol yet she became unconscious.Patient was brought to ED slightly combative moving all extremities spontaneously and purposefully.  Patient was started on bipap, given terbutaline, then stopped following commands, and ABG showed severe acidosis. Due to respiratory failure patient was intubated.  PCCM noted patient right pupil was dilated and non-reactive at time of exam.CXR showed 15% right pneumothorax with pneumomediastinum and diffuse subcutaneous emphysema. Head CT reading reported  poor delineation of the sulci and cisterns. Slightly poor definition of portions of the gray-white interface. Slightly low-lying right cerebellar tonsil.  Diffuse subcutaneous emphysema right neck greater on the rightt. This extends into the right orbital region.  At present time patient is on Nimbex, PCCM is placing bilateral chest tubes and initiating hypertonic saline protocol. Neurology was asked to evaluate patient for dilated pupil and evaluate for possible anoxic brain injury.     Past Medical History  Diagnosis Date  . Asthma     Family History: Not attainable at this time.   Social History:  Patient is intubated and unable to answer questions. She does not have a smoking history on file. She does not have any smokeless tobacco history on file. She reports that she does not drink alcohol. Her drug history not on file.  Allergies  Allergen Reactions  . Azithromycin     MEDICATIONS:                                                                                                                      Current Facility-Administered Medications  Medication Dose Route Frequency Provider Last Rate Last Dose  . [COMPLETED] 0.9 %  sodium chloride infusion   Intravenous Once Derwood Kaplan, MD 150 mL/hr at 02/14/12 0837    . 0.9 %  sodium chloride infusion  250 mL Intravenous PRN Merwyn Katos, MD      . [address] albuterol (PROVENTIL) (5 MG/ML) 0.5% nebulizer solution 2.5 mg  2.5 mg Nebulization Once Ankit Nanavati, MD   2.5 mg at 02/14/12 0810  . albuterol (PROVENTIL) (5 MG/ML) 0.5% nebulizer solution           . [COMPLETED] albuterol (PROVENTIL,VENTOLIN) solution continuous neb  10 mg/hr Nebulization Once Derwood Kaplan, MD   10 mg/hr at 02/14/12 0820  . budesonide (PULMICORT) nebulizer solution 0.25 mg  0.25 mg Nebulization QID Ankit Nanavati, MD   0.25 mg at 02/14/12 1017  . cefTRIAXone (ROCEPHIN) 1 g in dextrose 5 % 50 mL IVPB  1 g Intravenous Once Ankit Nanavati, MD      . cisatracurium (NIMBEX) 200 mg in sodium chloride 0.9 % 200 mL infusion  0.5-10 mcg/kg/min Intravenous Titrated Merwyn Katos, MD 45.5 mL/hr at 02/14/12 1018 8 mcg/kg/min at 02/14/12 1018  . doxycycline (VIBRAMYCIN) 100 mg in dextrose 5 % 250 mL IVPB  100 mg Intravenous Once Derwood Kaplan, MD      . [COMPLETED] etomidate (AMIDATE) 2 MG/ML injection        20 mg at 02/14/12 0750  . famotidine (PEPCID) IVPB 20 mg  20 mg Intravenous Q12H Merwyn Katos, MD      . fentaNYL (SUBLIMAZE) 10 mcg/mL in sodium chloride 0.9 % 250 mL infusion  50 mcg/hr Intravenous Continuous Merwyn Katos, MD      . D[address] fentaNYL (SUBLIMAZE) injection 25 mcg  25 mcg Intravenous Once Derwood Kaplan, MD   25 mcg at 02/14/12 0828  . heparin injection 5,000 Units  5,000 Units Subcutaneous Q8H Merwyn Katos, MD      . insulin aspart (novoLOG) injection 0-20 Units  0-20 Units Subcutaneous Q4H Merwyn Katos, MD      . [COMPLETED]  ipratropium (ATROVENT) nebulizer solution 0.5 mg  0.5 mg Nebulization Once Derwood Kaplan, MD   0.5 mg at 02/14/12 0810  . ipratropium (ATROVENT) nebulizer solution 0.5 mg  0.5 mg Nebulization Q6H Merwyn Katos, MD   0.5 mg at 02/14/12 1015  . levalbuterol (XOPENEX) nebulizer solution 0.63 mg  0.63 mg Nebulization Q3H PRN Merwyn Katos, MD      . levalbuterol [facility]) nebulizer solution 1.25 mg  1.25 mg Nebulization Q6H Merwyn Katos, MD      . lidocaine (cardiac) 100 mg/39ml (XYLOCAINE) 20 MG/ML injection 2%           . [COMPLETED] LORazepam (ATIVAN) injection 4 mg  4 mg Intravenous Once Derwood Kaplan, MD   4 mg at 02/14/12 0828  . [COMPLETED] LORazepam (ATIVAN) injection 4 mg  4 mg Intravenous Once Derwood Kaplan, MD   4 mg at 02/14/12 0802  . [COMPLETED] magnesium sulfate IVPB 2 g 50 mL  2 g Intravenous Once Derwood Kaplan, MD   2 g at 02/14/12 0825  . methylPREDNISolone sodium succinate (SOLU-MEDROL) 125 mg/2 mL injection 125 mg  125 mg Intravenous Q6H Merwyn Katos, MD      . propofol (DIPRIVAN) 10 mg/mL bolus/IV push           . propofol (DIPRIVAN) 10 mg/ml infusion  5-70 mcg/kg/min Intravenous Titrated Merwyn Katos, MD 11.4 mL/hr at 02/14/12 1017 20 mcg/kg/min at 02/14/12 1017  . rocuronium (ZEMURON) 50 MG/5ML injection           . sodium chloride (hypertonic) 3 % solution   Intravenous Continuous Onalee Hua  Ree Kida, MD      . D[address] sodium chloride 0.9 % bolus 1,000 mL  1,000 mL Intravenous Once Derwood Kaplan, MD 1,000 mL/hr at 02/14/12 0745 1,000 mL at 02/14/12 0745  . [COMPLETED] succinylcholine (ANECTINE) 20 MG/ML injection        100 mg at 02/14/12 0751  . [COMPLETED] terbutaline (BRETHINE) injection 0.25 mg  0.25 mg Subcutaneous Once Derwood Kaplan, MD   0.25 mg at 02/14/12 0748  . [DISCONTINUED] budesonide (PULMICORT) nebulizer solution 0.25 mg  0.25 mg Nebulization Q6H Merwyn Katos, MD      . [DISCONTINUED] dextrose 5 % and 0.45 % NaCl with KCl 20 mEq/L infusion    Intravenous Continuous Merwyn Katos, MD      . [DISCONTINUED] midazolam (VERSED) 1 mg/mL in sodium chloride 0.9 % 50 mL infusion  2 mg/hr Intravenous Continuous Derwood Kaplan, MD   2 mg/hr at 02/14/12 4098   No current outpatient prescriptions on file.     ROS:                                                                                                                                       History obtained from unobtainable from patient due to mental status   Blood pressure 190/110, pulse 55, temperature 97.8 F (36.6 C), temperature source Rectal, resp. rate 27, weight 94.802 kg (209 lb), SpO2 67.00%.   Neurologic Examination:                                                                                                      Mental Status: Patient on Nimbex, intubated and sedated on Propofol and Nimbex. Patient does not respond to verbal stimuli.  Does not respond to deep sternal rub.  Does not follow commands.  No verbalizations noted.  Cranial Nerves: II: patient does not respond confrontation bilaterally, pupils right 8 mm and unreactive, left 2 mm,and reactive to light. Right optic disc has decreased margins.   III,IV,VI: doll's response absent bilaterally.  V,VII: corneal reflex absent bilaterally  VIII: patient does not respond to verbal stimuli IX,X: gag reflex absent (on Nimbex), XI: trapezius strength unable to test bilaterally XII: tongue strength unable to test Motor: Extremities flaccid throughout.  No spontaneous movement noted.  No purposeful movements noted. Sensory: Does not respond to noxious stimuli in any extremity. Deep Tendon Reflexes:  Absent throughout. Plantars: mute bilaterally Cerebellar: Unable to perform     No components found with this basename: cbc,  bmp,  coags,  chol,  tri,  ldl,  hga1c    Results for orders placed during the hospital encounter of 02/14/12 (from the past 48 hour(s))  BLOOD GAS, ARTERIAL     Status: Abnormal  (Preliminary result)   Collection Time   02/14/12  7:30 AM      Component Value Range Comment   FIO2 1.00      Delivery systems BILEVEL POSITIVE AIRWAY PRESSURE      Mode BILEVEL POSITIVE AIRWAY PRESSURE      Inspiratory PAP 10      Expiratory PAP 5      pH, Arterial 6.964 (*) 7.350 - 7.450    pCO2 arterial PENDING  35.0 - 45.0 mmHg    pO2, Arterial 313.0 (*) 80.0 - 100.0 mmHg    Bicarbonate PENDING  20.0 - 24.0 mEq/L    TCO2 PENDING  0 - 100 mmol/L    Acid-Base Excess PENDING  0.0 - 2.0 mmol/L    Acid-base deficit PENDING  0.0 - 2.0 mmol/L    O2 Saturation 98.6      Patient temperature 98.6      Collection site RIGHT RADIAL      Drawn by [phone]      Sample type ARTERIAL DRAW      Allens test (pass/fail) PASS  PASS   GLUCOSE, CAPILLARY     Status: Abnormal   Collection Time   02/14/12  7:34 AM      Component Value Range Comment   Glucose-Capillary 197 (*) 70 - 99 mg/dL    Comment 1 Orig Pt ID [id number]     CBC WITH DIFFERENTIAL     Status: Abnormal   Collection Time   02/14/12  7:52 AM      Component Value Range Comment   WBC 22.5 (*) 4.0 - 10.5 K/uL WHITE COUNT CONFIRMED ON SMEAR   RBC 4.85  3.87 - 5.11 MIL/uL    Hemoglobin 13.9  12.0 - 15.0 g/dL    HCT 04.5  40.9 - 81.1 %    MCV 85.6  78.0 - 100.0 fL    MCH 28.7  26.0 - 34.0 pg    MCHC 33.5  30.0 - 36.0 g/dL    RDW 91.4  78.2 - 95.6 %    Platelets    150 - 400 K/uL    Value: PLATELET CLUMPS NOTED ON SMEAR, COUNT APPEARS ADEQUATE   Neutrophils Relative 55  43 - 77 %    Lymphocytes Relative 33  12 - 46 %    Monocytes Relative 7  3 - 12 %    Eosinophils Relative 4  0 - 5 %    Basophils Relative 1  0 - 1 %    Neutro Abs 12.4 (*) 1.7 - 7.7 K/uL    Lymphs Abs 7.4 (*) 0.7 - 4.0 K/uL    Monocytes Absolute 1.6 (*) 0.1 - 1.0 K/uL    Eosinophils Absolute 0.9 (*) 0.0 - 0.7 K/uL    Basophils Absolute 0.2 (*) 0.0 - 0.1 K/uL    WBC Morphology MILD LEFT SHIFT (1-5% METAS, OCC MYELO, OCC BANDS)      Smear Review PENDING PATHOLOGIST  REVIEW     BASIC METABOLIC PANEL     Status: Abnormal   Collection Time   02/14/12  7:52 AM      Component Value Range Comment   Sodium 139  135 - 145 mEq/L    Potassium 4.9  3.5 - 5.1 mEq/L  Chloride 103  96 - 112 mEq/L    CO2 24  19 - 32 mEq/L    Glucose, Bld 215 (*) 70 - 99 mg/dL    BUN 8  6 - 23 mg/dL    Creatinine, Ser 4.54  0.50 - 1.10 mg/dL    Calcium 9.4  8.4 - 09.8 mg/dL    GFR calc non Af Amer >90  >90 mL/min    GFR calc Af Amer >90  >90 mL/min   TROPONIN I     Status: Normal   Collection Time   02/14/12  7:52 AM      Component Value Range Comment   Troponin I <0.30  <0.30 ng/mL   PRO B NATRIURETIC PEPTIDE     Status: Normal   Collection Time   02/14/12  7:52 AM      Component Value Range Comment   Pro B Natriuretic peptide (BNP) 40.4  0 - 125 pg/mL   PREGNANCY, URINE     Status: Normal   Collection Time   02/14/12 10:15 AM      Component Value Range Comment   Preg Test, Ur NEGATIVE  NEGATIVE     Ct Head Wo Contrast  02/14/2012  *RADIOLOGY REPORT*  Clinical Data: Episode of the prolonged asthmatic attack now presenting with blown pupil.  Altered level of consciousness.  CT HEAD WITHOUT CONTRAST  Technique:  Contiguous axial images were obtained from the base of the skull through the vertex without contrast.  Comparison: None.  Findings: Motion degraded exam.  Poor delineation of the sulci and cisterns.  Slightly poor definition of portions of the gray-white interface.  Slightly low- lying right cerebellar tonsil.  These findings in combination with the patient's presenting history raise possibility of anoxic brain injury.  In a young person, CT imaging may over call this possibility and MR imaging would prove helpful for further delineation.  Given the slight motion degradation and lack of sulci, excluding intracranial hemorrhage is limited.  No gross intracranial hemorrhage noted.  Small ventricles without hydrocephalus.  No intracranial mass lesion detected on this  unenhanced exam.  Diffuse subcutaneous emphysema right neck greater on the right. This extends into the right orbital region and may contribute to the patient's right orbital symptoms rather than diffuse anoxia.  Critical Value/emergent results were called by telephone at the time of interpretation on 02/24/2012 at 12:10 a.m. to Dr. Sung Amabile, who verbally acknowledged these results.  IMPRESSION: Motion degraded exam.  Question anoxic brain injury.  Please see above discussion.  Diffuse subcutaneous emphysema extends into the right orbit and may contribute to the patient's right orbital symptoms.   Original Report Authenticated By: Lacy Duverney, M.D.    Dg Chest Portable 1 View  02/14/2012  *RADIOLOGY REPORT*  Clinical Data: Respiratory distress, altered level of consciousness, intubation  PORTABLE CHEST - 1 VIEW  Comparison: Portable exam 0809 hours without priors for comparison.  Findings: Tip of endotracheal tube 2.5 cm above carina. Nasogastric tube coiled in stomach. Upper normal heart size. Normal mediastinal contours and pulmonary vascularity. Bilateral upper lobe volume loss and slight superior retraction of the hila. Abnormal density in the medial left upper lobe question atelectasis versus infiltrate. Vague opacity at medial right apex. Remaining lungs well expanded and clear. No pleural effusion or pneumothorax.  IMPRESSION: Satisfactory endotracheal tube position. Abnormal left upper lobe and right apical densities, question atelectasis versus infiltrate in the left upper lobe. Right apical changes could potentially represent scarring, but underlying mass lesions in  the upper lobes are not excluded. Radiographic follow-up until resolution recommended. If these changes fail to resolve, CT chest with contrast would be warranted for further evaluation.   Original Report Authenticated By: Ulyses Southward, M.D.     Felicie Morn PA-C [facility] [phone]  02/14/2012, 11:55 AM   Patient seen  and examined.  Clinical course and management discussed.  Necessary edits performed.  I agree with the above.  Assessment and plan of care developed and discussed below.    Assessment: 21 year old female presenting in respiratory distress requiring intubation.  Due to decreased mental status prior to intubation head CT performed that not only showed subcutaneous air as described above but also was questionable for cerebral edema.  Patient with dilated and fixed pupil on the right on exam.  Likely related to amount of subcutaneous air on imaging but due to quality of imaging can not rule out the possibility of intracerebral edema either due to anoxia/hypercarbia or PRES, since patient presented with elevated BP, without further investigation.    Plan: 1.  Agree with use of hypertonic saline 2.  MRI of the brain 3.  BP control  This patient is critically ill and at significant risk of neurological worsening, death and care requires constant monitoring of vital signs, hemodynamics,respiratory and cardiac monitoring, neurological assessment, discussion with family, other specialists and medical decision making of high complexity. I spent 45 minutes of neurocritical care time  in the care of  this patient.  Thana Farr, MD [facility] [phone]  02/14/2012  1:54 PM

## 2012-02-14 NOTE — Progress Notes (Signed)
Respiratory therapy note- Two upper bilat chest tubes placed by Dr. Delford Field. Pt was then bronched for minimal secretions. Ventilator settings changed per Dr. Delford Field. PC 65, RR-28, 100%, peep +10. Pt. Was briefly bagged for sp02 in the 60's. Now sp02 100%. Cont to monitor. With xray and ABG pending.

## 2012-02-14 NOTE — Procedures (Addendum)
Bronchoscopy Procedure Note Alexandra Thornton [id number] 12-Jan-1991  Procedure: Bronchoscopy Indications: Remove secretions  Procedure Details Consent: Risks of procedure as well as the alternatives and risks of each were explained to the (patient/caregiver).  Consent for procedure obtained. Time Out: Verified patient identification, verified procedure, site/side was marked, verified correct patient position, special equipment/implants available, medications/allergies/relevent history reviewed, required imaging and test results available.  Performed  In preparation for procedure, patient was given 100% FiO2 and bronchoscope lubricated. Sedation: Benzodiazepines and Muscle relaxants  Airway entered and the following bronchi were examined: RUL, RML, RLL, LUL, LLL and Bronchi.   THick mucus plugs removed from both upper lobes and lower lobes.  Procedures performed: Bronch washings to remove secretions performed. Bronchoscope removed.  , Patient placed back on 100% FiO2 at conclusion of procedure.    Evaluation Hemodynamic Status: BP stable throughout; O2 sats: transiently fell during during procedure Patient's Current Condition: unstable Specimens:  None Complications: No apparent complications Patient did tolerate procedure well.   Dorcas Carrow Beeper  [phone]  Cell  [phone]  If no response or cell goes to voicemail, call beeper [phone]  02/14/2012

## 2012-02-14 NOTE — Progress Notes (Signed)
Noted event from 1200- 1745 with patient care 1. At 1325 pt received 1 amp bicarb. 1327- 1 amp calcium chloride. 1335- 10 Units regular insulin IV. 1336- 1 amp bicarb. All of the above per MD order.  2. Multiple critical values received. All critical values were reported to MD who is at bedside and new orders received and completed.

## 2012-02-14 NOTE — Procedures (Signed)
Arterial Catheter Insertion Procedure Note Arcadia Gorgas [id number] 08-18-1990  Procedure: Insertion of Arterial Catheter  Indications: Blood pressure monitoring  Procedure Details Consent: Risks of procedure as well as the alternatives and risks of each were explained to the (patient/caregiver).  Consent for procedure obtained. Time Out: Verified patient identification, verified procedure, site/side was marked, verified correct patient position, special equipment/implants available, medications/allergies/relevent history reviewed, required imaging and test results available.  Performed  Maximum sterile technique was used including cap, gloves, gown, mask and sheet. Skin prep: Chlorhexidine; local anesthetic administered 20 gauge catheter was inserted into right radial artery using the Seldinger technique.  Evaluation Blood flow good; BP tracing good. Complications: No apparent complications.   Newt Lukes 02/14/2012

## 2012-02-14 NOTE — Progress Notes (Signed)
eLink Physician-Brief Progress Note Patient Name: Alexandra Thornton DOB: 1990/11/13 MRN: [id number]  Date of Service  02/14/2012   HPI/Events of Note   Hyperkalemia, hypotension.  Last pH 6.8.  Latest CXR reviewed - worsening bilateral pneumothoraces.     eICU Interventions   Asked Dr. Delford Field to evaluate for additional chest tube placement Bicarbonate gtt rate increased to 250 mL/h PEEP decreased to 0 Ca / Insulin / D50 given Neo-Synephrine gtt started    Intervention Category Major Interventions: Hypercarbia - evaluation and management;Hypotension - evaluation and management;Arrhythmia - evaluation and management  Celise Bazar 02/14/2012, 6:06 PM

## 2012-02-14 NOTE — ED Notes (Signed)
If need to get in touch with mother call [facility] [phone], Mother's name Lannette Avellino

## 2012-02-14 NOTE — H&P (Addendum)
PCCM ADMISSION NOTE  Date of admission: 12/04  Pt Profile: 28 F [name] presented to [facility] ED via EMS with severe respiratory distress and intubated for severe hypercarbia due to status asthmaticus  SIGNIFICANT EVENTS/STUDIES: 12/04 CT head: Question anoxic brain injury.  12/04 hypertonic saline protocol 12/04 Neuro Consult -   LINES/TUBES: ETT 12/04 >>  R [place] CVL 12/04 >>  R radial A line 12/04 >> L chest tube 12/04 >>  R chest tube 12/04 >>   MICRO:  None  ABX:  None  PROPHYLAXIS: DVT: SQ heparin SUP: H2RB  CONSULTANTS:    HPI: Hx as above. Pt is intubated and unable to provide history. A friend contacted EMS as patient was unable to ambulate to friend's car. Per friend, pt's cognition was intact prior to EMS arrival. Reportedly was in [facility] night prior to admission. In ED pt was obtunded and incontinent of urine and stool. After intubation, peak airway pressures were extremely elevated   PMH: Per ED personnel, pt has history of asthma and panic attacks  MEDICATIONS: unknown  SH: Dietitian. Reportedly a nonsmoker  No family history on file.  ROS Unavailable  Filed Vitals:   02/14/12 0802 02/14/12 0815 02/14/12 0817 02/14/12 0833  BP: 190/110     Pulse: 101 136 55   Resp: 17 21 27    Weight:    94.802 kg (209 lb)  SpO2: 98% 100% 67%     EXAM:  Gen: Intubated, dyssynchronous, disheveled HEENT: NCAT Neck: No JVD Lungs: prolonged diffuse expiratory wheezes Cardiovascular: RRR s M, 2+ DP pulses Abdomen: Obese, soft, NT, diminished BS Ext: No edema, warm Neuro: R pupil 7-8 mm and minimally reactive, L pupil 3 mm and minimally reactive  No spontaneous movement  Tone normal to decreased  DTRs symmetric  No pathological reflexes Skin: WNL  DATA: ABG: 6.96/pCO2 too high to register/313 (prior to intubation)  Other labs pending   CXR: hyperinflated, no acute infiltrates  CT head pending   IMPRESSION:    Principal Problem:  *Acute respiratory failure with hypercapnia Active Problems:  Acute encephalopathy, hypoxic and hypercarbic  Status asthmaticus  Anisocoria  Hypercarbic cerebral edema  Subcutaneous emphysema - pneumothorax suspected  Hyperkalemia, presumed - wide complex on tele  Steroid-induced hyperglycemia  Panic attacks, history of   PLAN:  Vent settings established - permissive hypercarbia Propofol, fentanyl and nimbex Systemic and nebulized steroids Scheduled and PRN bronchodilators Empiric SSI in setting of systemic steroids STAT CT head to eval ? blown R pupil Mother has been contacted and is on the way from out of town  [facility] [phone])    ADD 1300: Findings of CT head reviewed and discussed in detail with Dr Corky Downs Hypertonic saline protocol initiated Neuro consult obtained Bilateral chest tubes placed due to very high PIPs, moderate SQ emphysema and question of pneumomediastinum  Still awaiting arrival of mother  ADD 1345: widening complex on telemetry - presumed hyperaklemia - received Ca2+, HCO3, D50 and insulin with resolution. HCO3 gtt started  ADD 1600: Family arrived and has been updated in detail. Condition remains critical with little change overall. Will recheck CXR now   120 mins CCM time   Billy Fischer, MD ; [facility] [phone].  After 5:30 PM or weekends, call [phone]

## 2012-02-14 NOTE — Procedures (Signed)
Chest Tube Insertion Procedure Note  Indications:  Clinically significant Pneumothorax  Pre-operative Diagnosis: Pneumothorax  Post-operative Diagnosis: Pneumothorax  Procedure Details  Informed consent was obtained for the procedure, including sedation.  Risks of lung perforation, hemorrhage, arrhythmia, and adverse drug reaction were discussed.   After sterile skin prep, using standard technique, a 28 French tube was placed in the right lateral 7th rib space.  Findings: None  Estimated Blood Loss:  Minimal         Specimens:  None              Complications:  None; patient tolerated the procedure well.         Disposition: ICU - intubated and critically ill.         Condition: stable  Attending Attestation: I performed the procedure.   Billy Fischer, MD ; [facility] [phone].  After 5:30 PM or weekends, call [phone]

## 2012-02-14 NOTE — Procedures (Signed)
Chest Tube Insertion Procedure Note  Indications:  Clinically significant Pneumothorax  Pre-operative Diagnosis: Pneumothorax  Post-operative Diagnosis: Pneumothorax  Procedure Details  Informed consent was obtained for the procedure, including sedation.  Risks of lung perforation, hemorrhage, arrhythmia, and adverse drug reaction were discussed.   After sterile skin prep, using standard technique, a 20 French tube was placed in the left lateral 8th rib space.  Findings: None  Estimated Blood Loss:  Minimal         Specimens:  None              Complications:  None; patient tolerated the procedure well.         Disposition: ICU - intubated and critically ill.         Condition: unstable  Attending Attestation: I was present for the entire procedure.  Procedure perfromed by ACNP Alonza Smoker, MD ; [facility] service Mobile [phone].  After 5:30 PM or weekends, call [phone]

## 2012-02-15 ENCOUNTER — Inpatient Hospital Stay (HOSPITAL_COMMUNITY): Payer: BC Managed Care – PPO

## 2012-02-15 ENCOUNTER — Inpatient Hospital Stay (HOSPITAL_COMMUNITY)
Admit: 2012-02-15 | Discharge: 2012-02-15 | Disposition: A | Discharge status: Home / Self Care | Payer: BC Managed Care – PPO | Attending: Neurology | Admitting: Neurology

## 2012-02-15 LAB — BLOOD GAS, ARTERIAL
Acid-Base Excess: 1.2 mmol/L (ref 0.0–2.0)
Acid-Base Excess: 5.7 mmol/L — ABNORMAL HIGH (ref 0.0–2.0)
Bicarbonate: 30.1 mEq/L — ABNORMAL HIGH (ref 20.0–24.0)
Bicarbonate: 31.9 mEq/L — ABNORMAL HIGH (ref 20.0–24.0)
Delivery systems: POSITIVE
Drawn by: 295031
Drawn by: 244901
Drawn by: 244901
FIO2: 1 %
FIO2: 1 %
FIO2: 0.1 %
FIO2: 0.6 %
FIO2: 0.4 %
Inspiratory PAP: 10
Mode: POSITIVE
O2 Saturation: 98.6 %
O2 Saturation: 93.8 %
O2 Saturation: 99.3 %
O2 Saturation: 96.8 %
O2 Saturation: 95.4 %
PEEP: 5 cmH2O
PEEP: 8 cmH2O
PEEP: 5 cmH2O
PEEP: 5 cmH2O
Patient temperature: 98.6
Patient temperature: 98.3
Patient temperature: 99.6
Patient temperature: 102
Patient temperature: 98.6
Pressure control: 60 cmH2O
Pressure control: 60 cmH2O
Pressure control: 45 cmH2O
RATE: 12 resp/min
RATE: 30 resp/min
RATE: 30 resp/min
RATE: 30 resp/min
TCO2: 27.2 mmol/L (ref 0–100)
pCO2 arterial: 62.2 mmHg (ref 35.0–45.0)
pH, Arterial: 6.871 — CL (ref 7.350–7.450)
pH, Arterial: 7.317 — ABNORMAL LOW (ref 7.350–7.450)
pO2, Arterial: 123 mmHg — ABNORMAL HIGH (ref 80.0–100.0)
pO2, Arterial: 98.8 mmHg (ref 80.0–100.0)
pO2, Arterial: 159 mmHg — ABNORMAL HIGH (ref 80.0–100.0)
pO2, Arterial: 82 mmHg (ref 80.0–100.0)
pO2, Arterial: 69.2 mmHg — ABNORMAL LOW (ref 80.0–100.0)

## 2012-02-15 LAB — CBC
HCT: 39.5 % (ref 36.0–46.0)
Hemoglobin: 12.8 g/dL (ref 12.0–15.0)
MCH: 28.4 pg (ref 26.0–34.0)
MCHC: 32.4 g/dL (ref 30.0–36.0)
RDW: 12.8 % (ref 11.5–15.5)

## 2012-02-15 LAB — BASIC METABOLIC PANEL
BUN: 9 mg/dL (ref 6–23)
Calcium: 8.3 mg/dL — ABNORMAL LOW (ref 8.4–10.5)
Creatinine, Ser: 0.94 mg/dL (ref 0.50–1.10)
GFR calc Af Amer: >90 mL/min (ref >90)
GFR calc non Af Amer: 87 mL/min — ABNORMAL LOW (ref >90)
Glucose, Bld: 142 mg/dL — ABNORMAL HIGH (ref 70–99)

## 2012-02-15 LAB — SODIUM: Sodium: 151 mEq/L — ABNORMAL HIGH (ref 135–145)

## 2012-02-15 LAB — GLUCOSE, CAPILLARY: Glucose-Capillary: 129 mg/dL — ABNORMAL HIGH (ref 70–99)

## 2012-02-15 MED ORDER — LEVALBUTEROL TARTRATE 45 MCG/ACT IN AERO: 8.0000 | INHALATION_SPRAY | RESPIRATORY_TRACT | Status: DC | PRN

## 2012-02-15 MED ORDER — INSULIN ASPART 100 UNIT/ML ~~LOC~~ SOLN
0.0000 [IU] | Freq: Four times a day (QID) | SUBCUTANEOUS | Status: DC
  Administered 2012-02-15 – 2012-02-16 (×3): 3 [IU] via SUBCUTANEOUS
  Administered 2012-02-16: 4 [IU] via SUBCUTANEOUS
  Administered 2012-02-16: 3 [IU] via SUBCUTANEOUS
  Administered 2012-02-17 (×2): 4 [IU] via SUBCUTANEOUS
  Administered 2012-02-17: 3 [IU] via SUBCUTANEOUS
  Administered 2012-02-17 – 2012-02-18 (×3): 4 [IU] via SUBCUTANEOUS

## 2012-02-15 MED ORDER — VITAL AF 1.2 CAL PO LIQD
1000.0000 mL | ORAL | Status: DC
  Administered 2012-02-15 – 2012-02-17 (×2): 1000 mL
  Filled 2012-02-15 (×2): qty 1000

## 2012-02-15 MED ORDER — FLUTICASONE PROPIONATE HFA 220 MCG/ACT IN AERO
4.0000 | INHALATION_SPRAY | Freq: Two times a day (BID) | RESPIRATORY_TRACT | Status: DC
  Administered 2012-02-15 – 2012-02-18 (×6): 4 via RESPIRATORY_TRACT

## 2012-02-15 MED ORDER — PNEUMOCOCCAL VAC POLYVALENT 25 MCG/0.5ML IJ INJ
0.5000 mL | INJECTION | Freq: Once | INTRAMUSCULAR | Status: DC
  Filled 2012-02-15 (×2): qty 0.5

## 2012-02-15 MED ORDER — INFLUENZA VIRUS VACC SPLIT PF IM SUSP
0.5000 mL | Freq: Once | INTRAMUSCULAR | Status: DC
  Filled 2012-02-15: qty 0.5

## 2012-02-15 NOTE — H&P (Signed)
PCCM ADMISSION NOTE  Date of admission: 12/04  Pt Profile: 65 F [name] presented to [facility] ED via EMS with severe respiratory distress and intubated for severe hypercarbia due to status asthmaticus  SIGNIFICANT EVENTS/STUDIES: 12/04 Very difficult to ventilate - Nimbex, propofol 12/04 Anisocoria noted 12/04 CT head: Question cerebral edema  12/04 hypertonic saline protocol initiated 12/04 Neuro Consult - 3% NaCl, MRI ordered (too unstable to travel), EEG ordered 12/04 Episode of severe hyperkalemia with widening complex >> responded to Ca2+, HCO3 12/04 increased bilateral PTX - bilateral anterior chest tubes placed with resolution 12/04 FOB - extensive mucus plugging 12/05 Much improved vent mechanics. Nimbex and propofol continued 12/05 No air leak on any of the chest tube chambers 12/05 Anisocoria resolved - hypertonic saline discontinued 12/05 HCO3 gtt discontinued 12/05 EEG >>   LINES/TUBES: ETT 12/04 >>  R [place] CVL 12/04 >>  R radial A line 12/04 >> L chest tube 12/04 >>  R chest tube 12/04 >>  L anterior chest tube 12/04 >>  R anterior chest tube 12/04 >>   MICRO:  MRSA PCR 12/04 >> NEG Urine 12/05 >>  Resp 12/05 >>  Blood 12/05 >>   ABX:  Ceftrx 12/04 >>   PROPHYLAXIS: DVT: SQ heparin SUP: H2RB  CONSULTANTS:  Neuro [name]) 12/04 >>    Filed Vitals:   02/15/12 0930 02/15/12 0945 02/15/12 1000 02/15/12 1030  BP:      Pulse: 112 112 112 110  Temp:      TempSrc:      Resp: 14 14 14 16   Height:      Weight:      SpO2: 95% 96% 95% 96%    EXAM:  Gen: Intubated, sedated, paralyzed HEENT: NCAT Neck: No JVD Lungs: prolonged diffuse expiratory wheezes Cardiovascular: RRR s M, 2+ DP pulses Abdomen: Obese, soft, NT, diminished BS Ext: No edema, warm Neuro: pupils equal and react, rest of exam limited by NMBs Skin: WNL  DATA: CBC    Component Value Date/Time   WBC 17.9* 02/15/2012 0400   RBC 4.50 02/15/2012 0400    HGB 12.8 02/15/2012 0400   HCT 39.5 02/15/2012 0400   PLT 298 02/15/2012 0400   MCV 87.8 02/15/2012 0400   MCH 28.4 02/15/2012 0400   MCHC 32.4 02/15/2012 0400   RDW 12.8 02/15/2012 0400   LYMPHSABS 7.4* 02/14/2012 0752   MONOABS 1.6* 02/14/2012 0752   EOSABS 0.9* 02/14/2012 0752   BASOSABS 0.2* 02/14/2012 0752    BMET    Component Value Date/Time   NA 151* 02/15/2012 0955   K 3.8 02/15/2012 0400   CL 111 02/15/2012 0400   CO2 28 02/15/2012 0400   GLUCOSE 142* 02/15/2012 0400   BUN 9 02/15/2012 0400   CREATININE 0.94 02/15/2012 0400   CALCIUM 8.3* 02/15/2012 0400   GFRNONAA 87* 02/15/2012 0400   GFRAA >90 02/15/2012 0400    ABG    Component Value Date/Time   PHART 7.317* 02/15/2012 0421   PCO2ART 62.2* 02/15/2012 0421   PO2ART 82.0 02/15/2012 0421   HCO3 30.1* 02/15/2012 0421   TCO2 27.2 02/15/2012 0421   ACIDBASEDEF 2.1* 02/14/2012 2321   O2SAT 96.6 02/15/2012 0421    CXR: bilateral chest tubes, BUL atx improved but persists L>R, small pneumomediastinum     IMPRESSION:   Principal Problem:  *Status asthmaticus Active Problems:  Acute respiratory failure with hypercapnia  Acute encephalopathy, hypoxic and hypercarbic  Anisocoria, resolved 12/05  ?  Hypercarbic cerebral edema  Tension pneumothorax, spontaneous  Hyperkalemia  Steroid-induced hyperglycemia  Panic attacks, history of   PLAN:  Vent changes made Cont Propofol, fentanyl and nimbex Cont Systemic and nebulized steroids Cont Scheduled and PRN bronchodilators Cont Empiric SSI in setting of systemic steroids EEG per Neuro Cont chest tubes to suction D/C hypertonic saline and HCO3 gtt Repeat ABG this afternoon Begin trickle feeds Family has been updated in detail   40 mins CCM time   Billy Fischer, MD ; [facility] service Mobile [phone].  After 5:30 PM or weekends, call [phone]

## 2012-02-15 NOTE — Progress Notes (Signed)
Subjective: Patient remains intubated, sedated and unresponsive.  Family at bedside.  EEG performed and shows no evidence of subclinical seizure activity.    Objective: Current vital signs: BP 148/67  Pulse 98  Temp 99.8 F (37.7 C) (Axillary)  Resp 16  Ht 5\' 4"  (1.626 m)  Wt 106.4 kg (234 lb 9.1 oz)  BMI 40.26 kg/m2  SpO2 95% Vital signs in last 24 hours: Temp:  [99.6 F (37.6 C)-102 F (38.9 C)] 99.8 F (37.7 C) (12/05 2000) Pulse Rate:  [93-122] 98  (12/05 2200) Resp:  [0-30] 16  (12/05 2200) BP: (95-148)/(44-67) 148/67 mmHg (12/05 1600) SpO2:  [93 %-98 %] 95 % (12/05 2200) Arterial Line BP: (88-159)/(44-82) 129/54 mmHg (12/05 2200) FiO2 (%):  [40 %-70 %] 40 % (12/05 2200) Weight:  [106.4 kg (234 lb 9.1 oz)] 106.4 kg (234 lb 9.1 oz) (12/05 0558)  Intake/Output from previous day: 12/04 0701 - 12/05 0700 In: 4190.8 [I.V.:3680.8; IV Piggyback:510] Out: 2140 [Urine:1820; Emesis/NG output:120; Chest Tube:200] Intake/Output this shift: Total I/O In: 301 [I.V.:231; NG/GT:20; IV Piggyback:50] Out: 75 [Urine:75] Nutritional status: NPO  Neurologic Exam: Mental Status: Patient does not respond to verbal stimuli.  Does not respond to deep sternal rub.  Does not follow commands.  No verbalizations noted.  Cranial Nerves: II: patient does not respond confrontation bilaterally, pupils right 2 mm, left 2 mm,and unreactive bilaterally III,IV,VI: doll's response absent bilaterally.  V,VII: corneal reflex absent bilaterally  VIII: patient does not respond to verbal stimuli IX,X: gag reflex not tested, XI: trapezius strength unable to test bilaterally XII: tongue strength unable to test Motor: Extremities flaccid throughout.  No spontaneous movement noted.  No purposeful movements noted. Sensory: Does not respond to noxious stimuli in any extremity. Deep Tendon Reflexes:  Absent throughout. Plantars: mute bilaterally Cerebellar: Unable to perform    Lab Results: Basic  Metabolic Panel:  Lab 02/15/12 1610 02/15/12 0400 02/14/12 2300 02/14/12 1805 02/14/12 1615 02/14/12 0752  NA 151* 149* 144 143 142 --  K -- 3.8 -- >7.5* >7.5* 4.9  CL -- 111 -- 111 109 103  CO2 -- 28 -- 33* 34* 24  GLUCOSE -- 142* -- 261* 116* 215*  BUN -- 9 -- 12 11 8   CREATININE -- 0.94 -- 1.35* 1.18* 0.70  CALCIUM -- 8.3* -- 9.2 8.3* --  MG -- -- -- -- -- --  PHOS -- -- -- -- -- --    Liver Function Tests: No results found for this basename: AST:5,ALT:5,ALKPHOS:5,BILITOT:5,PROT:5,ALBUMIN:5 in the last 168 hours No results found for this basename: LIPASE:5,AMYLASE:5 in the last 168 hours No results found for this basename: AMMONIA:3 in the last 168 hours  CBC:  Lab 02/15/12 0400 02/14/12 0752  WBC 17.9* 22.5*  NEUTROABS -- 12.4*  HGB 12.8 13.9  HCT 39.5 41.5  MCV 87.8 85.6  PLT 298 PLATELET CLUMPS NOTED ON SMEAR, COUNT APPEARS ADEQUATE    Cardiac Enzymes:  Lab 02/14/12 0752  CKTOTAL --  CKMB --  CKMBINDEX --  TROPONINI <0.30    Lipid Panel: No results found for this basename: CHOL:5,TRIG:5,HDL:5,CHOLHDL:5,VLDL:5,LDLCALC:5 in the last 168 hours  CBG:  Lab 02/15/12 2051 02/15/12 1609 02/15/12 1156 02/15/12 0729 02/15/12 0348  GLUCAP 135* 128* 116* 129* 147*    Microbiology: Results for orders placed during the hospital encounter of 02/14/12  MRSA PCR SCREENING     Status: Normal   Collection Time   02/14/12  3:18 PM      Component Value Range Status Comment  MRSA by PCR NEGATIVE  NEGATIVE Final   CULTURE, RESPIRATORY     Status: Normal (Preliminary result)   Collection Time   02/15/12  4:56 AM      Component Value Range Status Comment   Specimen Description TRACHEAL ASPIRATE   Final    Special Requests NONE   Final    Gram Stain     Final    Value: ABUNDANT WBC PRESENT, PREDOMINANTLY PMN     RARE SQUAMOUS EPITHELIAL CELLS PRESENT     RARE GRAM POSITIVE COCCI     IN PAIRS IN CHAINS   Culture PENDING   Incomplete    Report Status PENDING   Incomplete      Coagulation Studies: No results found for this basename: LABPROT:5,INR:5 in the last 72 hours  Imaging: Dg Chest 1 View  02/14/2012  *RADIOLOGY REPORT*  Clinical Data: Subcutaneous emphysema.  CHEST - 1 VIEW  Comparison: 02/14/2012.  Findings: The endotracheal tube is in good position and stable. The NG tube is in the stomach.  Interval development of fairly significant bilateral subcutaneous emphysema involving the upper chest and neck.  This also evidence of pneumomediastinum.  A right- sided pneumothorax is noted.  This is estimated at 15%.  No left- sided pneumothorax.  IMPRESSION: Interval development of a 15% right-sided pneumothorax with pneumomediastinum and diffuse subcutaneous emphysema.   Original Report Authenticated By: Rudie Meyer, M.D.    Ct Head Wo Contrast  02/14/2012  *RADIOLOGY REPORT*  Clinical Data: Episode of the prolonged asthmatic attack now presenting with blown pupil.  Altered level of consciousness.  CT HEAD WITHOUT CONTRAST  Technique:  Contiguous axial images were obtained from the base of the skull through the vertex without contrast.  Comparison: None.  Findings: Motion degraded exam.  Poor delineation of the sulci and cisterns.  Slightly poor definition of portions of the gray-white interface.  Slightly low- lying right cerebellar tonsil.  These findings in combination with the patient's presenting history raise possibility of anoxic brain injury.  In a young person, CT imaging may over call this possibility and MR imaging would prove helpful for further delineation.  Given the slight motion degradation and lack of sulci, excluding intracranial hemorrhage is limited.  No gross intracranial hemorrhage noted.  Small ventricles without hydrocephalus.  No intracranial mass lesion detected on this unenhanced exam.  Diffuse subcutaneous emphysema right neck greater on the right. This extends into the right orbital region and may contribute to the patient's right orbital  symptoms rather than diffuse anoxia.  Critical Value/emergent results were called by telephone at the time of interpretation on 02/24/2012 at 12:10 a.m. to Dr. Sung Amabile, who verbally acknowledged these results.  IMPRESSION: Motion degraded exam.  Question anoxic brain injury.  Please see above discussion.  Diffuse subcutaneous emphysema extends into the right orbit and may contribute to the patient's right orbital symptoms.   Original Report Authenticated By: Lacy Duverney, M.D.    Portable Chest Xray In Am  02/15/2012  *RADIOLOGY REPORT*  Clinical Data: Respiratory failure.  Asthma.  Pneumomediastinum and pneumothorax.  PORTABLE CHEST - 1 VIEW  Comparison: 02/14/2012  Findings: The patient is rotated to the right on today's exam, resulting in reduced diagnostic sensitivity and specificity.   Two chest tubes are observed on each side, similar position to prior except that the chest tube extending to the right lung base now extends to the lateral lung base instead of the medial lung base. Endotracheal tube tip is 1.7 cm above the carina.  Nasogastric tube extends into the stomach.  Right central line tip projects over the lower SVC.  Subcutaneous emphysema appears improved.  Pneumomediastinum is most appreciable tracking along the lower neck. Improved aeration in the right upper lobe.  Improved aeration in the left upper lobe. Indistinctly marginated rounded densities in the right paramediastinal region, bilateral perihilar regions, and right lung base are observed.  Cardiothoracic index 52%, within normal limits for technique.  I do not observe significant residual pneumothorax.  IMPRESSION:  1.  Improved aeration in both upper lobes. 2.  Interval involvement of indistinct rounded densities in the perihilar regions, medial right upper lobe, and right lung base. Although these could represent patchy regions of atelectasis, pneumonia or atypical infectious process cannot be excluded. 3.  Pneumomediastinum. 4.   Improved subcutaneous emphysema.   Original Report Authenticated By: Gaylyn Rong, M.D.    Dg Chest Port 1 View  02/14/2012  *RADIOLOGY REPORT*  Clinical Data: Chest tube placement  PORTABLE CHEST - 1 VIEW  Comparison: Prior films same day  Findings: Cardiomediastinal silhouette is stable.  Endotracheal tube in place with tip 2.9 cm above the carina.  NG tube in place. Bilateral 2 chest tubes in place are noted.  There is no diagnostic pneumothorax.  There is a right subclavian central line with tip in SVC.  There is  right upper lobe medial atelectasis or infiltrate. Persistent subcutaneous emphysema chest wall and bilateral supraclavicular region.  Stable small pneumomediastinum. Persistent left upper lobe medially atelectasis or infiltrate.  IMPRESSION:  Endotracheal tube in place with tip 2.9 cm above the carina.  NG tube in place.  Bilateral 2 chest tubes in place are noted.  There is no diagnostic pneumothorax.  There is a right subclavian central line with tip in SVC.  There is a right upper lobe medial atelectasis or infiltrate.  Persistent subcutaneous emphysema chest wall and bilateral supraclavicular region.  Stable small pneumomediastinum. Persistent left upper lobe medially atelectasis or infiltrate.   Original Report Authenticated By: Natasha Mead, M.D.    Dg Chest Port 1 View  02/14/2012  *RADIOLOGY REPORT*  Clinical Data: Acute respiratory failure.  Bilateral pneumothoraces.  Endotracheal tube placement.  PORTABLE CHEST - 1 VIEW  Comparison: Prior today  Findings: Bilateral chest tubes remain in place.  Bilateral pneumothoraces again seen, with mild enlargement of the left apical pneumothorax.  There is increased atelectasis or consolidation seen in both lung apices and the right lower lung.  Endotracheal tube and nasogastric tube remain in appropriate position.  Right subclavian center venous catheter is also in satisfactory position.  Heart size is normal.  IMPRESSION:  1.  Support  apparatus in appropriate position. 2.  Bilateral pneumothoraces, with mild enlargement of the left apical pneumothorax. 2.  Increased atelectasis or consolidation in both lung apices and right lower lung.   Original Report Authenticated By: Myles Rosenthal, M.D.    Dg Chest Port 1 View  02/14/2012  *RADIOLOGY REPORT*  Clinical Data: Respiratory failure.  Bilateral chest tubes.  PORTABLE CHEST - 1 VIEW  Comparison: One-view chest 02/14/2012 at 13.  Findings: Bilateral chest tubes have been placed.  The endotracheal tube and NG tube are stable.  A right subclavian line has been placed.  The tip is in the distal SVC.  Subcutaneous gas is present over the chest bilaterally, more prominently on the right. Bilateral pneumothoraces are evident on this scan. There is no change in the right-sided pneumothorax.  The left pneumothorax is more evident.  Mediastinal structures are  midline.  No fractures are identified.  IMPRESSION:  1.  Status post placement of bilateral chest tubes with bilateral apical pneumothoraces. 2.  Bilateral subcutaneous emphysema. 3.  Interval placement of right subclavian line.   Original Report Authenticated By: Marin Roberts, M.D.    Dg Chest Portable 1 View  02/14/2012  *RADIOLOGY REPORT*  Clinical Data: Respiratory distress, altered level of consciousness, intubation  PORTABLE CHEST - 1 VIEW  Comparison: Portable exam 0809 hours without priors for comparison.  Findings: Tip of endotracheal tube 2.5 cm above carina. Nasogastric tube coiled in stomach. Upper normal heart size. Normal mediastinal contours and pulmonary vascularity. Bilateral upper lobe volume loss and slight superior retraction of the hila. Abnormal density in the medial left upper lobe question atelectasis versus infiltrate. Vague opacity at medial right apex. Remaining lungs well expanded and clear. No pleural effusion or pneumothorax.  IMPRESSION: Satisfactory endotracheal tube position. Abnormal left upper lobe and right  apical densities, question atelectasis versus infiltrate in the left upper lobe. Right apical changes could potentially represent scarring, but underlying mass lesions in the upper lobes are not excluded. Radiographic follow-up until resolution recommended. If these changes fail to resolve, CT chest with contrast would be warranted for further evaluation.   Original Report Authenticated By: Ulyses Southward, M.D.     Medications:  I have reviewed the patient's current medications. Scheduled:   . antiseptic oral rinse  15 mL Mouth Rinse QID  . artificial tears   Both Eyes Q8H  . chlorhexidine  15 mL Mouth Rinse BID  . famotidine (PEPCID) IV  20 mg Intravenous Q12H  . fluticasone  4 puff Inhalation BID  . heparin  5,000 Units Subcutaneous Q8H  . influenza  inactive virus vaccine  0.5 mL Intramuscular Once  . insulin aspart  0-20 Units Subcutaneous Q6H  . levalbuterol  8 puff Inhalation Q2H  . methylPREDNISolone (SOLU-MEDROL) injection  125 mg Intravenous Q6H  . pneumococcal 23 valent vaccine  0.5 mL Intramuscular Once  . [DISCONTINUED] fluticasone  2 puff Inhalation BID  . [DISCONTINUED] insulin aspart  0-20 Units Subcutaneous Q4H    Assessment/Plan:  Patient Active Hospital Problem List:  Anisocoria (02/14/2012)   Assessment: No longer present.  Pupils small and unreactive.  Patient remains sedated.     Plan: Will continue to follow clinically ? Hypercarbic cerebral edema (02/14/2012)   Assessment: MRI pending.  EEG shows no evidence epileptiform activity and is consistent with sedation.   Plan: Imaging to be performed once patient medically stable.    Case discussed with family    LOS: 1 day   Thana Farr, MD [facility] [phone] 02/15/2012  11:21 PM

## 2012-02-15 NOTE — Progress Notes (Signed)
EEG completed.

## 2012-02-15 NOTE — Progress Notes (Signed)
eLink Physician-Brief Progress Note Patient Name: Alexandra Thornton DOB: 01-28-1991 MRN: [id number]  Date of Service  02/15/2012   HPI/Events of Note   Fever 102F Pt on doxy + ceftriaxone  eICU Interventions  Blood, urine, resp cx sent   Intervention Category Intermediate Interventions: Infection - evaluation and management  BYRUM,ROBERT S. 02/15/2012, 4:00 AM

## 2012-02-15 NOTE — Procedures (Signed)
Arterial Catheter Insertion Procedure Note Audreyana Huntsberry [id number] 1991/01/10  Procedure: Insertion of Arterial Catheter  Indications: Blood pressure monitoring  Procedure Details Consent: Risks of procedure as well as the alternatives and risks of each were explained to the (patient/caregiver).  Consent for procedure obtained. Time Out: Verified patient identification, verified procedure, site/side was marked, verified correct patient position, special equipment/implants available, medications/allergies/relevent history reviewed, required imaging and test results available.  Performed  Maximum sterile technique was used including cap, gloves, gown, hand hygiene, mask and sheet. Skin prep: Chlorhexidine; local anesthetic administered 20 gauge catheter was inserted into left radial artery using the Seldinger technique.  Evaluation Blood flow good; BP tracing good. Complications: No apparent complications.   Melanee Spry 02/15/2012

## 2012-02-15 NOTE — Progress Notes (Signed)
eLink Physician-Brief Progress Note Patient Name: Alexandra Thornton DOB: 06/18/1990 MRN: [id number]  Date of Service  02/15/2012   HPI/Events of Note     eICU Interventions  PC decreased to 40, RR decreased to 16 Na bicarb gtt decreased to 75cc/h   Intervention Category Major Interventions: Respiratory failure - evaluation and management  Zabdi Mis S. 02/15/2012, 4:56 AM

## 2012-02-15 NOTE — Progress Notes (Signed)
eLink Physician-Brief Progress Note Patient Name: Alexandra Thornton DOB: [birth year] MRN: [id number]  Date of Service  02/15/2012   HPI/Events of Note     eICU Interventions  Decrease PC to 45, decrease RR to 20 ABG in the am   Intervention Category Major Interventions: Respiratory failure - evaluation and management  Humbert Morozov S. 02/15/2012, 2:28 AM

## 2012-02-16 ENCOUNTER — Inpatient Hospital Stay (HOSPITAL_COMMUNITY): Payer: BC Managed Care – PPO

## 2012-02-16 ENCOUNTER — Encounter (HOSPITAL_COMMUNITY): Payer: Self-pay | Admitting: Pulmonary Disease

## 2012-02-16 DIAGNOSIS — R7309 Other abnormal glucose: Secondary | ICD-10-CM

## 2012-02-16 DIAGNOSIS — J930 Spontaneous tension pneumothorax: Secondary | ICD-10-CM

## 2012-02-16 DIAGNOSIS — J9600 Acute respiratory failure, unspecified whether with hypoxia or hypercapnia: Secondary | ICD-10-CM

## 2012-02-16 DIAGNOSIS — J45902 Unspecified asthma with status asthmaticus: Secondary | ICD-10-CM

## 2012-02-16 LAB — URINE CULTURE: Colony Count: NO GROWTH

## 2012-02-16 LAB — GLUCOSE, CAPILLARY
Glucose-Capillary: 122 mg/dL — ABNORMAL HIGH (ref 70–99)
Glucose-Capillary: 134 mg/dL — ABNORMAL HIGH (ref 70–99)
Glucose-Capillary: 145 mg/dL — ABNORMAL HIGH (ref 70–99)
Glucose-Capillary: 155 mg/dL — ABNORMAL HIGH (ref 70–99)

## 2012-02-16 LAB — BASIC METABOLIC PANEL
BUN: 20 mg/dL (ref 6–23)
CO2: 34 mEq/L — ABNORMAL HIGH (ref 19–32)
CO2: 32 mEq/L (ref 19–32)
Calcium: 8.6 mg/dL (ref 8.4–10.5)
Calcium: 8.9 mg/dL (ref 8.4–10.5)
Chloride: 111 mEq/L (ref 96–112)
Creatinine, Ser: 0.72 mg/dL (ref 0.50–1.10)
GFR calc non Af Amer: >90 mL/min (ref >90)
Glucose, Bld: 143 mg/dL — ABNORMAL HIGH (ref 70–99)
Glucose, Bld: 157 mg/dL — ABNORMAL HIGH (ref 70–99)
Potassium: 3.5 mEq/L (ref 3.5–5.1)
Sodium: 148 mEq/L — ABNORMAL HIGH (ref 135–145)
Sodium: 148 mEq/L — ABNORMAL HIGH (ref 135–145)

## 2012-02-16 LAB — CBC
HCT: 34.9 % — ABNORMAL LOW (ref 36.0–46.0)
Hemoglobin: 11.1 g/dL — ABNORMAL LOW (ref 12.0–15.0)
MCH: 28.5 pg (ref 26.0–34.0)
MCV: 89.5 fL (ref 78.0–100.0)
Platelets: 240 10*3/uL (ref 150–400)
RBC: 3.9 MIL/uL (ref 3.87–5.11)
WBC: 13.3 10*3/uL — ABNORMAL HIGH (ref 4.0–10.5)

## 2012-02-16 MED ORDER — IPRATROPIUM BROMIDE HFA 17 MCG/ACT IN AERS
8.0000 | INHALATION_SPRAY | Freq: Four times a day (QID) | RESPIRATORY_TRACT | Status: DC
  Administered 2012-02-16 – 2012-02-18 (×9): 8 via RESPIRATORY_TRACT
  Filled 2012-02-16: qty 12.9

## 2012-02-16 MED ORDER — FENTANYL CITRATE 0.05 MG/ML IJ SOLN
25.0000 ug | INTRAMUSCULAR | Status: DC | PRN
  Administered 2012-02-16 – 2012-02-18 (×5): 100 ug via INTRAVENOUS
  Filled 2012-02-16 (×5): qty 2

## 2012-02-16 MED ORDER — METHYLPREDNISOLONE SODIUM SUCC 125 MG IJ SOLR
80.0000 mg | Freq: Three times a day (TID) | INTRAMUSCULAR | Status: DC
  Administered 2012-02-16 – 2012-02-18 (×6): 80 mg via INTRAVENOUS
  Filled 2012-02-16 (×9): qty 1.28

## 2012-02-16 MED ORDER — POTASSIUM CHLORIDE 2 MEQ/ML IV SOLN
INTRAVENOUS | Status: DC
  Administered 2012-02-16 – 2012-02-19 (×3): via INTRAVENOUS
  Filled 2012-02-16 (×9): qty 1000

## 2012-02-16 NOTE — Progress Notes (Signed)
Chaplain follow up with family.  Family utilizing [employer].   Provided support with pt's mother and sisters at bedside.  Prayed with family and provided prayer shawl.    Family expresses joy in moving toward pt's recovery and weaning off vent.  Hopeful that pt will be better able to manage illness.    Belva Crome  MDiv, Chaplain

## 2012-02-16 NOTE — Progress Notes (Signed)
eLink Physician-Brief Progress Note Patient Name: Alexandra Thornton DOB: 1990-05-15 MRN: [id number]  Date of Service  02/16/2012   HPI/Events of Note   No PTX bilaterally.  eICU Interventions  Per Dr. Sung Amabile, remove both anterior bilateral chest tubes.      Christophr Calix 02/16/2012, 6:32 PM

## 2012-02-16 NOTE — Progress Notes (Signed)
Pt telemetry monitor alarm sounding asystole.  RN at bedside. Pts HR increased back to 60's within seconds. PCCM NP at bedside and ordered bmet. EKG strip added to chart. Will continue to monitor.

## 2012-02-16 NOTE — Procedures (Signed)
ELECTROENCEPHALOGRAM REPORT   Patient: Alexandra Thornton       Room #: [facility] 1233 EEG No. ID: [id number] Age: 21 y.o.        Sex: female Referring Physician: Sung Amabile Report Date:  02/16/2012        Interpreting Physician: Thana Farr D  History: [name] is an 20 y.o. female admitted in respiratory failure.  Now intubated and sedated.    Medications:  I have reviewed the patient's current medications. Scheduled:   . antiseptic oral rinse  15 mL Mouth Rinse QID  . artificial tears   Both Eyes Q8H  . chlorhexidine  15 mL Mouth Rinse BID  . famotidine (PEPCID) IV  20 mg Intravenous Q12H  . fluticasone  4 puff Inhalation BID  . heparin  5,000 Units Subcutaneous Q8H  . influenza  inactive virus vaccine  0.5 mL Intramuscular Once  . insulin aspart  0-20 Units Subcutaneous Q6H  . levalbuterol  8 puff Inhalation Q2H  . methylPREDNISolone (SOLU-MEDROL) injection  125 mg Intravenous Q6H  . pneumococcal 23 valent vaccine  0.5 mL Intramuscular Once  . [DISCONTINUED] fluticasone  2 puff Inhalation BID  . [DISCONTINUED] insulin aspart  0-20 Units Subcutaneous Q4H    Conditions of Recording:  This is a 16 channel EEG carried out with the patient in the unresponsive state.  Description:  The waking background activity consists of a low to moderate voltage polymorphic delta activity.  Background activity is continuous.  Frequently during the tracing superimposed on this slow background activity are well organized spindles of alpha and at times beta activity.  This activity is symmetrical.   Hyperventilation was not performed.  Intermittent photic stimulation was performed but failed to illicit any change in the tracing.  Other reactive procedures were performed as well without any change noted in the tracing.    IMPRESSION: This is an abnormal EEG secondary to general background slowing.  No epileptiform activity was noted.  Findings consistent with current sedation.     Thana Farr,  MD [facility] [phone] 02/16/2012, 7:38 AM

## 2012-02-16 NOTE — Progress Notes (Signed)
PCCM ADMISSION NOTE  Date of admission: 12/04  Pt Profile: 54 F [name] presented to [facility] ED via EMS with severe respiratory distress and intubated for severe hypercarbia due to status asthmaticus  SIGNIFICANT EVENTS/STUDIES: 12/04 Very difficult to ventilate - Nimbex, propofol 12/04 Anisocoria noted 12/04 CT head: Question cerebral edema  12/04 hypertonic saline protocol initiated 12/04 Neuro Consult - 3% NaCl, MRI ordered (too unstable to travel), EEG ordered 12/04 Episode of severe hyperkalemia with widening complex >> responded to Ca2+, HCO3 12/04 increased bilateral PTX - bilateral anterior chest tubes placed with resolution 12/04 FOB - extensive mucus plugging 12/05 Much improved vent mechanics. Nimbex and propofol continued 12/05 No air leak on any of the chest tube chambers 12/05 Anisocoria resolved - hypertonic saline discontinued 12/05 HCO3 gtt discontinued 12/05 EEG: general background slowing. No epileptiform activity was noted. Findings consistent with current sedation.  12/06: NMBs D/C'd  LINES/TUBES: ETT 12/04 >>  R [place] CVL 12/04 >>  R radial A line 12/04 >> 12/05 L radial A-line 12/05 >>  L chest tube 12/04 >>  R chest tube 12/04 >>  L anterior chest tube 12/04 >>  R anterior chest tube 12/04 >>   MICRO:  MRSA PCR 12/04 >> NEG Urine 12/05 >> NEG Resp 12/05 >>  Blood 12/05 >>   ABX:  Ceftrx 12/04 >>   PROPHYLAXIS: DVT: SQ heparin SUP: H2RB  CONSULTANTS:  Neuro [name]) 12/04 >>    Filed Vitals:   02/16/12 0800 02/16/12 0900 02/16/12 1000 02/16/12 1100  BP:      Pulse: 83 82 75 75  Temp: 98 F (36.7 C)     TempSrc: Oral     Resp: 11 16 10 11   Height:      Weight:      SpO2: 98% 97% 95% 96%    EXAM:  Gen: Intubated, sedated, paralyzed HEENT: NCAT Neck: No JVD Lungs: coarse diffuse wheezes, no air leak on any chest tubes Cardiovascular: RRR s M, 2+ DP pulses Abdomen: Obese, soft, NT, diminished  BS Ext: No edema, warm Neuro: pupils equal and react Skin: WNL  DATA: CBC    Component Value Date/Time   WBC 13.3* 02/16/2012 0422   RBC 3.90 02/16/2012 0422   HGB 11.1* 02/16/2012 0422   HCT 34.9* 02/16/2012 0422   PLT 240 02/16/2012 0422   MCV 89.5 02/16/2012 0422   MCH 28.5 02/16/2012 0422   MCHC 31.8 02/16/2012 0422   RDW 13.0 02/16/2012 0422   LYMPHSABS 7.4* 02/14/2012 0752   MONOABS 1.6* 02/14/2012 0752   EOSABS 0.9* 02/14/2012 0752   BASOSABS 0.2* 02/14/2012 0752    BMET    Component Value Date/Time   NA 148* 02/16/2012 0422   K 3.5 02/16/2012 0422   CL 111 02/16/2012 0422   CO2 34* 02/16/2012 0422   GLUCOSE 143* 02/16/2012 0422   BUN 14 02/16/2012 0422   CREATININE 0.69 02/16/2012 0422   CALCIUM 8.6 02/16/2012 0422   GFRNONAA >90 02/16/2012 0422   GFRAA >90 02/16/2012 0422    ABG    Component Value Date/Time   PHART 7.363 02/15/2012 1500   PCO2ART 57.5* 02/15/2012 1500   PO2ART 69.2* 02/15/2012 1500   HCO3 31.9* 02/15/2012 1500   TCO2 29.4 02/15/2012 1500   ACIDBASEDEF 2.1* 02/14/2012 2321   O2SAT 95.4 02/15/2012 1500    CXR: patchy AS dz, no PTX seen   IMPRESSION:   Principal Problem:  *Status asthmaticus Active  Problems:  Acute respiratory failure with hypercapnia  Acute encephalopathy, hypoxic and hypercarbic  Anisocoria, resolved 12/05  ? Hypercarbic cerebral edema  Tension pneumothorax, spontaneous  Hyperkalemia  Steroid-induced hyperglycemia  Panic attacks, history of   PLAN:  Vent changes made D/C NMBs Cont Propofol Change fentanyl to PRN Decrease Systemic steroids Cont Fluticasone MDI Cont Scheduled and PRN bronchodilators Cont Empiric SSI in setting of systemic steroids Cont chest tubes to suction Repeat ABG this afternoon Advance trickle feeds Chest tubes to water seal  Recheck CXR this afternooon - if no PTX, will consider D/C anterior chest tubes Family has been updated in detail   40 mins CCM time   Billy Fischer, MD ; [facility]  service Mobile [phone].  After 5:30 PM or weekends, call [phone]

## 2012-02-16 NOTE — Progress Notes (Signed)
INITIAL ADULT NUTRITION ASSESSMENT Date: 02/16/2012   Time: 9:48 AM Reason for Assessment: vent  INTERVENTION: 1.  Enteral nutrition; Enteral nutrition to provide 60-70% of estimated calorie needs (22-25 kcals/kg ideal body weight) and >/= 90% of estimated protein needs, based on ASPEN guidelines for permissive underfeeding in critically ill obese individuals.  Based on current diprivan rate, regimen best able to meet protein needs would be decrease of Vital 1.2 to 10 mL/hr and addition of Prostat 6 times daily to provide 1566 kcal, 108g protein, and 182 mL free water.  MD to manage.   DOCUMENTATION CODES Per approved criteria  -Morbid Obesity    ASSESSMENT: Female 21 y.o.  Dx: Status asthmaticus  Hx:  Past Medical History  Diagnosis Date  . Asthma    History reviewed. No pertinent past surgical history.  Related Meds:  Scheduled Meds:   . antiseptic oral rinse  15 mL Mouth Rinse QID  . artificial tears   Both Eyes Q8H  . chlorhexidine  15 mL Mouth Rinse BID  . famotidine (PEPCID) IV  20 mg Intravenous Q12H  . fluticasone  4 puff Inhalation BID  . heparin  5,000 Units Subcutaneous Q8H  . influenza  inactive virus vaccine  0.5 mL Intramuscular Once  . insulin aspart  0-20 Units Subcutaneous Q6H  . ipratropium  8 puff Inhalation Q6H  . levalbuterol  8 puff Inhalation Q2H  . methylPREDNISolone (SOLU-MEDROL) injection  80 mg Intravenous Q8H  . pneumococcal 23 valent vaccine  0.5 mL Intramuscular Once  . [DISCONTINUED] fluticasone  2 puff Inhalation BID  . [DISCONTINUED] insulin aspart  0-20 Units Subcutaneous Q4H  . [DISCONTINUED] methylPREDNISolone (SOLU-MEDROL) injection  125 mg Intravenous Q6H   Continuous Infusions:   . dextrose 5 % with kcl 50 mL/hr at 02/16/12 0928  . feeding supplement (VITAL AF 1.2 CAL) 1,000 mL (02/15/12 1313)  . propofol 30 mcg/kg/min (02/16/12 0911)  . [DISCONTINUED] cisatracurium (NIMBEX) infusion 2 mcg/kg/min (02/16/12 1610)  .  [DISCONTINUED] fentaNYL infusion INTRAVENOUS 100 mcg/hr (02/16/12 0800)  . [DISCONTINUED] norepinephrine (LEVOPHED) Adult infusion Stopped (02/15/12 0015)  . [DISCONTINUED] phenylephrine (NEO-SYNEPHRINE) Adult infusion Stopped (02/14/12 1945)  . [DISCONTINUED]  sodium bicarbonate infusion 1000 mL Stopped (02/15/12 1037)  . [DISCONTINUED] sodium chloride (hypertonic) Stopped (02/15/12 1037)   PRN Meds:.sodium chloride, fentaNYL, levalbuterol, [DISCONTINUED] ipratropium, [DISCONTINUED] levalbuterol   Ht: 5\' 4"  (162.6 cm)  Wt: 238 lb 8.6 oz (108.2 kg)  Ideal Wt: 54.5 kg % Ideal Wt: 198%  Usual Wt: 210 lbs % Usual Wt: unknown  Body mass index is 40.94 kg/(m^2).  Food/Nutrition Related Hx: no known nutrition issues PTA per family, pt is a Archivist living on campus  Labs:  [facility]     Component Value Date/Time   NA 148* 02/16/2012 0422   K 3.5 02/16/2012 0422   CL 111 02/16/2012 0422   CO2 34* 02/16/2012 0422   GLUCOSE 143* 02/16/2012 0422   BUN 14 02/16/2012 0422   CREATININE 0.69 02/16/2012 0422   CALCIUM 8.6 02/16/2012 0422   GFRNONAA >90 02/16/2012 0422   GFRAA >90 02/16/2012 0422    CBC    Component Value Date/Time   WBC 13.3* 02/16/2012 0422   RBC 3.90 02/16/2012 0422   HGB 11.1* 02/16/2012 0422   HCT 34.9* 02/16/2012 0422   PLT 240 02/16/2012 0422   MCV 89.5 02/16/2012 0422   MCH 28.5 02/16/2012 0422   MCHC 31.8 02/16/2012 0422   RDW 13.0 02/16/2012 0422   LYMPHSABS 7.4* 02/14/2012 9604  MONOABS 1.6* 02/14/2012 0752   EOSABS 0.9* 02/14/2012 0752   BASOSABS 0.2* 02/14/2012 0752    Intake: NPO Output:   Intake/Output Summary (Last 24 hours) at 02/16/12 0952 Last data filed at 02/16/12 0800  Gross per 24 hour  Intake 1915.3 ml  Output    891 ml  Net 1024.3 ml    Diet Order: NPO  Supplements/Tube Feeding: Vital 1.2 @ 20 mL/hr  IVF:    dextrose 5 % with kcl Last Rate: 50 mL/hr at 02/16/12 0928  feeding supplement (VITAL AF 1.2 CAL) Last Rate: 1,000 mL (02/15/12  1313)  propofol Last Rate: 30 mcg/kg/min (02/16/12 0911)  [DISCONTINUED] cisatracurium (NIMBEX) infusion Last Rate: 2 mcg/kg/min (02/16/12 4098)  [DISCONTINUED] fentaNYL infusion INTRAVENOUS Last Rate: 100 mcg/hr (02/16/12 0800)  [DISCONTINUED] norepinephrine (LEVOPHED) Adult infusion Last Rate: Stopped (02/15/12 0015)  [DISCONTINUED] phenylephrine (NEO-SYNEPHRINE) Adult infusion Last Rate: Stopped (02/14/12 1945)  [DISCONTINUED]  sodium bicarbonate infusion 1000 mL Last Rate: Stopped (02/15/12 1037)  [DISCONTINUED] sodium chloride (hypertonic) Last Rate: Stopped (02/15/12 1037)    Estimated Nutritional Needs:   Kcal: 1310-1535 Protein: 109-136g Fluid: >1.6 L/day  Pt admitted with respiratory distress.  Question of anoxic brain injury on admission, however pupils have normalized , EEG shows no evidence of epileptiform activity.  Patient is currently intubated on ventilator support.  MV: 9.1 L/min Temp:Temp (24hrs), Avg:99.6 F (37.6 C), Min:98 F (36.7 C), Max:100.6 F (38.1 C)  Propofol: 25.7 ml/hr which provides 678 kcal/day.  Per RN, may need to increase propofol infusion today.   NUTRITION DIAGNOSIS: -Inadequate oral intake (NI-2.1).  Status: Ongoing  RELATED TO: mechanical ventilation  AS EVIDENCE BY: pt intubated, NPO  MONITORING/EVALUATION(Goals): 1.  Enteral nutrition; advancement per MD to pt meeting >90% of estimated need  EDUCATION NEEDS: -Education needs addressed   Loyce Dys, MS RD LDN Clinical Inpatient Dietitian Pager: [phone] Weekend/After hours pager: [phone]

## 2012-02-16 NOTE — Progress Notes (Signed)
Subjective: Patient remains sedated.  Has been somewhat responsive to respiratory.  Had reactive and equal pupils with medication withdrawal this morning.    Objective: Current vital signs: BP 148/67  Pulse 63  Temp 99.3 F (37.4 C) (Axillary)  Resp 14  Ht 5\' 4"  (1.626 m)  Wt 108.2 kg (238 lb 8.6 oz)  BMI 40.94 kg/m2  SpO2 97% Vital signs in last 24 hours: Temp:  [98 F (36.7 C)-99.8 F (37.7 C)] 99.3 F (37.4 C) (12/06 1600) Pulse Rate:  [57-100] 63  (12/06 1700) Resp:  [0-18] 14  (12/06 1700) SpO2:  [94 %-99 %] 97 % (12/06 1700) Arterial Line BP: (121-166)/(54-87) 161/85 mmHg (12/06 1900) FiO2 (%):  [30 %-40 %] 30 % (12/06 1615) Weight:  [108.2 kg (238 lb 8.6 oz)] 108.2 kg (238 lb 8.6 oz) (12/06 0630)  Intake/Output from previous day: 12/05 0701 - 12/06 0700 In: 2408.1 [I.V.:2108.1; NG/GT:200; IV Piggyback:100] Out: 991 [Urine:945; Chest Tube:46] Intake/Output this shift:   Nutritional status: NPO  Neurologic Exam: Mental Status:  Patient does not respond to verbal stimuli. Does not respond to deep sternal rub. Does not follow commands. No verbalizations noted.  Cranial Nerves:  II: patient does not respond confrontation bilaterally, pupils right 2 mm, left 2 mm,and unreactive bilaterally  III,IV,VI: doll's response absent bilaterally.  V,VII: corneal reflex absent bilaterally  VIII: patient does not respond to verbal stimuli  IX,X: gag reflex not tested, XI: trapezius strength unable to test bilaterally  XII: tongue strength unable to test  Motor:  Extremities flaccid throughout. No spontaneous movement noted. No purposeful movements noted.  Sensory:  Does not respond to noxious stimuli in any extremity.  Deep Tendon Reflexes:  Absent throughout.  Plantars:  mute bilaterally  Cerebellar:  Unable to perform   Lab Results: Basic Metabolic Panel:  Lab 02/16/12 4098 02/16/12 0422 02/15/12 0955 02/15/12 0400 02/14/12 2300 02/14/12 1805 02/14/12 1615  NA 148*  148* 151* 149* 144 -- --  K 3.6 3.5 -- 3.8 -- >7.5* >7.5*  CL 109 111 -- 111 -- 111 109  CO2 32 34* -- 28 -- 33* 34*  GLUCOSE 157* 143* -- 142* -- 261* 116*  BUN 20 14 -- 9 -- 12 11  CREATININE 0.72 0.69 -- 0.94 -- 1.35* 1.18*  CALCIUM 8.9 8.6 -- 8.3* -- -- --  MG -- -- -- -- -- -- --  PHOS -- -- -- -- -- -- --    Liver Function Tests: No results found for this basename: AST:5,ALT:5,ALKPHOS:5,BILITOT:5,PROT:5,ALBUMIN:5 in the last 168 hours No results found for this basename: LIPASE:5,AMYLASE:5 in the last 168 hours No results found for this basename: AMMONIA:3 in the last 168 hours  CBC:  Lab 02/16/12 0422 02/15/12 0400 02/14/12 0752  WBC 13.3* 17.9* 22.5*  NEUTROABS -- -- 12.4*  HGB 11.1* 12.8 13.9  HCT 34.9* 39.5 41.5  MCV 89.5 87.8 85.6  PLT 240 298 PLATELET CLUMPS NOTED ON SMEAR, COUNT APPEARS ADEQUATE    Cardiac Enzymes:  Lab 02/14/12 0752  CKTOTAL --  CKMB --  CKMBINDEX --  TROPONINI <0.30    Lipid Panel: No results found for this basename: CHOL:5,TRIG:5,HDL:5,CHOLHDL:5,VLDL:5,LDLCALC:5 in the last 168 hours  CBG:  Lab 02/16/12 1158 02/16/12 0541 02/16/12 0008 02/15/12 2051 02/15/12 1609  GLUCAP 155* 134* 122* 135* 128*    Microbiology: Results for orders placed during the hospital encounter of 02/14/12  MRSA PCR SCREENING     Status: Normal   Collection Time   02/14/12  3:18  PM      Component Value Range Status Comment   MRSA by PCR NEGATIVE  NEGATIVE Final   URINE CULTURE     Status: Normal   Collection Time   02/15/12  4:51 AM      Component Value Range Status Comment   Specimen Description URINE, CATHETERIZED   Final    Special Requests Normal   Final    Culture  Setup Time 02/15/2012 09:25   Final    Colony Count NO GROWTH   Final    Culture NO GROWTH   Final    Report Status 02/16/2012 FINAL   Final   CULTURE, RESPIRATORY     Status: Normal (Preliminary result)   Collection Time   02/15/12  4:56 AM      Component Value Range Status Comment    Specimen Description TRACHEAL ASPIRATE   Final    Special Requests NONE   Final    Gram Stain     Final    Value: ABUNDANT WBC PRESENT, PREDOMINANTLY PMN     RARE SQUAMOUS EPITHELIAL CELLS PRESENT     RARE GRAM POSITIVE COCCI     IN PAIRS IN CHAINS   Culture Non-Pathogenic Oropharyngeal-type Flora Isolated.   Final    Report Status PENDING   Incomplete   CULTURE, BLOOD (ROUTINE X 2)     Status: Normal (Preliminary result)   Collection Time   02/15/12  5:15 AM      Component Value Range Status Comment   Specimen Description BLOOD LEFT FOOT   Final    Special Requests BOTTLES DRAWN AEROBIC AND ANAEROBIC 5CC   Final    Culture  Setup Time 02/15/2012 09:23   Final    Culture     Final    Value:        BLOOD CULTURE RECEIVED NO GROWTH TO DATE CULTURE WILL BE HELD FOR 5 DAYS BEFORE ISSUING A FINAL NEGATIVE REPORT   Report Status PENDING   Incomplete   CULTURE, BLOOD (ROUTINE X 2)     Status: Normal (Preliminary result)   Collection Time   02/15/12  5:20 AM      Component Value Range Status Comment   Specimen Description BLOOD RIGHT FOOT   Final    Special Requests BOTTLES DRAWN AEROBIC AND ANAEROBIC 5CC   Final    Culture  Setup Time 02/15/2012 09:23   Final    Culture     Final    Value:        BLOOD CULTURE RECEIVED NO GROWTH TO DATE CULTURE WILL BE HELD FOR 5 DAYS BEFORE ISSUING A FINAL NEGATIVE REPORT   Report Status PENDING   Incomplete     Coagulation Studies: No results found for this basename: LABPROT:5,INR:5 in the last 72 hours  Imaging: Dg Chest Port 1 View  02/16/2012  *RADIOLOGY REPORT*  Clinical Data: 21 year old female with status asthmaticus - follow- up bilateral pneumothoraces.  PORTABLE CHEST - 1 VIEW  Comparison: 02/16/2012 and prior chest radiographs dating back to 02/14/2012.  Findings: Cardiomediastinal silhouette is unchanged. Bilateral thoracostomy tubes are again identified and unchanged. There is no evidence of pneumothorax. Peribronchial thickening is noted  as well as mild bibasilar atelectasis. A right subclavian central venous catheter is present with tip overlying the cavoatrial junction. An endotracheal tube is noted with tip 1.5 cm above the carina. An NG tube is present coiled in the stomach.  IMPRESSION: Unchanged chest radiograph with support apparatus as described - no evidence  of pneumothorax.   Original Report Authenticated By: Harmon Pier, M.D.    Dg Chest Port 1 View  02/16/2012  *RADIOLOGY REPORT*  Clinical Data: Respiratory failure.  PORTABLE CHEST - 1 VIEW  Comparison: 02/15/2012  Findings: The endotracheal tube, nasogastric tube, right subclavian central venous line and dual left and right chest tubes are stable.  No convincing pneumothorax.  Pneumomediastinum evident on the previous exam is not appreciated currently.  Mild perihilar and medial lung base opacity is likely atelectasis. No overt pulmonary edema.  There is some mild hazy opacity laterally at the right lung base that may also be a atelectasis although infiltrate is possible.  These lung findings are similar to the previous exam allowing for differences in patient positioning.  IMPRESSION: Support apparatus is stable and well-positioned.  No convincing pneumothorax.  Pneumomediastinum not evident on the current exam. Lung opacities mostly at the medial bases, most likely atelectasis, similar to the previous day's study.   Original Report Authenticated By: Amie Portland, M.D.    Portable Chest Xray In Am  02/15/2012  *RADIOLOGY REPORT*  Clinical Data: Respiratory failure.  Asthma.  Pneumomediastinum and pneumothorax.  PORTABLE CHEST - 1 VIEW  Comparison: 02/14/2012  Findings: The patient is rotated to the right on today's exam, resulting in reduced diagnostic sensitivity and specificity.   Two chest tubes are observed on each side, similar position to prior except that the chest tube extending to the right lung base now extends to the lateral lung base instead of the medial lung base.  Endotracheal tube tip is 1.7 cm above the carina.  Nasogastric tube extends into the stomach.  Right central line tip projects over the lower SVC.  Subcutaneous emphysema appears improved.  Pneumomediastinum is most appreciable tracking along the lower neck. Improved aeration in the right upper lobe.  Improved aeration in the left upper lobe. Indistinctly marginated rounded densities in the right paramediastinal region, bilateral perihilar regions, and right lung base are observed.  Cardiothoracic index 52%, within normal limits for technique.  I do not observe significant residual pneumothorax.  IMPRESSION:  1.  Improved aeration in both upper lobes. 2.  Interval involvement of indistinct rounded densities in the perihilar regions, medial right upper lobe, and right lung base. Although these could represent patchy regions of atelectasis, pneumonia or atypical infectious process cannot be excluded. 3.  Pneumomediastinum. 4.  Improved subcutaneous emphysema.   Original Report Authenticated By: Gaylyn Rong, M.D.     Medications:  I have reviewed the patient's current medications. Scheduled:   . antiseptic oral rinse  15 mL Mouth Rinse QID  . artificial tears   Both Eyes Q8H  . chlorhexidine  15 mL Mouth Rinse BID  . famotidine (PEPCID) IV  20 mg Intravenous Q12H  . fluticasone  4 puff Inhalation BID  . heparin  5,000 Units Subcutaneous Q8H  . influenza  inactive virus vaccine  0.5 mL Intramuscular Once  . insulin aspart  0-20 Units Subcutaneous Q6H  . ipratropium  8 puff Inhalation Q6H  . levalbuterol  8 puff Inhalation Q2H  . methylPREDNISolone (SOLU-MEDROL) injection  80 mg Intravenous Q8H  . pneumococcal 23 valent vaccine  0.5 mL Intramuscular Once  . [DISCONTINUED] methylPREDNISolone (SOLU-MEDROL) injection  125 mg Intravenous Q6H    Assessment/Plan: ? Hypercarbic cerebral edema (02/14/2012)   Assessment: MRI remains pending   Plan: Imaging to be performed once patient medically  stable.  Will continue to follow clinically.    LOS: 2 days   Verlon Au  [name], MD [facility] [phone] 02/16/2012  7:29 PM

## 2012-02-17 ENCOUNTER — Inpatient Hospital Stay (HOSPITAL_COMMUNITY): Payer: BC Managed Care – PPO

## 2012-02-17 ENCOUNTER — Other Ambulatory Visit: Payer: Self-pay

## 2012-02-17 DIAGNOSIS — J811 Chronic pulmonary edema: Secondary | ICD-10-CM | POA: Diagnosis present

## 2012-02-17 DIAGNOSIS — J930 Spontaneous tension pneumothorax: Secondary | ICD-10-CM

## 2012-02-17 DIAGNOSIS — R7309 Other abnormal glucose: Secondary | ICD-10-CM

## 2012-02-17 LAB — BASIC METABOLIC PANEL
BUN: 26 mg/dL — ABNORMAL HIGH (ref 6–23)
CO2: 31 mEq/L (ref 19–32)
Calcium: 9.2 mg/dL (ref 8.4–10.5)
Creatinine, Ser: 0.76 mg/dL (ref 0.50–1.10)
GFR calc non Af Amer: >90 mL/min (ref >90)
Glucose, Bld: 156 mg/dL — ABNORMAL HIGH (ref 70–99)

## 2012-02-17 LAB — CULTURE, RESPIRATORY W GRAM STAIN

## 2012-02-17 LAB — CBC
MCH: 27.7 pg (ref 26.0–34.0)
MCHC: 31.1 g/dL (ref 30.0–36.0)
MCV: 89.1 fL (ref 78.0–100.0)
Platelets: 279 10*3/uL (ref 150–400)
RBC: 4.33 MIL/uL (ref 3.87–5.11)
RDW: 12.9 % (ref 11.5–15.5)

## 2012-02-17 LAB — BLOOD GAS, ARTERIAL
Drawn by: 308601
O2 Saturation: 97.6 %
PEEP: 5 cmH2O
Patient temperature: 98.6
RATE: 16 resp/min
pH, Arterial: 7.494 — ABNORMAL HIGH (ref 7.350–7.450)
pO2, Arterial: 82.5 mmHg (ref 80.0–100.0)

## 2012-02-17 LAB — GLUCOSE, CAPILLARY
Glucose-Capillary: 156 mg/dL — ABNORMAL HIGH (ref 70–99)
Glucose-Capillary: 159 mg/dL — ABNORMAL HIGH (ref 70–99)
Glucose-Capillary: 163 mg/dL — ABNORMAL HIGH (ref 70–99)

## 2012-02-17 MED ORDER — INFLUENZA VIRUS VACC SPLIT PF IM SUSP
0.5000 mL | INTRAMUSCULAR | Status: AC | PRN
  Administered 2012-02-21: 0.5 mL via INTRAMUSCULAR
  Filled 2012-02-17: qty 0.5

## 2012-02-17 MED ORDER — POTASSIUM CHLORIDE 20 MEQ/15ML (10%) PO LIQD
40.0000 meq | Freq: Once | ORAL | Status: AC
  Administered 2012-02-17: 40 meq
  Filled 2012-02-17: qty 30

## 2012-02-17 MED ORDER — PNEUMOCOCCAL VAC POLYVALENT 25 MCG/0.5ML IJ INJ
0.5000 mL | INJECTION | INTRAMUSCULAR | Status: AC | PRN
  Administered 2012-02-23: 0.5 mL via INTRAMUSCULAR
  Filled 2012-02-17: qty 0.5

## 2012-02-17 MED ORDER — FUROSEMIDE 10 MG/ML IJ SOLN
40.0000 mg | Freq: Once | INTRAMUSCULAR | Status: AC
  Administered 2012-02-17: 40 mg via INTRAVENOUS
  Filled 2012-02-17: qty 4

## 2012-02-17 NOTE — Progress Notes (Signed)
WUA -Titrated propofol down to 5 mcg/kg.  Fentanyl 100 mcg was administered at 0627 am per handoff report.  Patient opened eyes a few times and sit up in the bed twice while waking up.  Accessory muscle use and work of breathing increased significantly by 10 am.  Propofol increased to 15 mcg.  Patient was switched from 5/5 to 5/14.  Improved respiratory work of breathing.  Fentanyl 100 mcg IV administer to decrease work of breathing and accessory muscle use.  Placed back on full support due to need for additional support.  Patient never did follow commands but did open eyes, though not to command.  Movement of all extremities observed.  Propofol continues at same rate transfusing before WUA. Will continue to monitor.

## 2012-02-17 NOTE — Progress Notes (Signed)
E-link Md called d/t pt increased B/P on a-line, no new orders per MD. [name]

## 2012-02-17 NOTE — Progress Notes (Signed)
PCCM NOTE  Date of admission: 12/04  Pt Profile: 24 F [name] presented to [facility] ED via EMS with severe respiratory distress and intubated for severe hypercarbia due to status asthmaticus  SIGNIFICANT EVENTS/STUDIES: 12/04 Very difficult to ventilate - Nimbex, propofol 12/04 Anisocoria noted 12/04 CT head: Question cerebral edema  12/04 hypertonic saline protocol initiated 12/04 Neuro Consult - 3% NaCl, MRI ordered (too unstable to travel), EEG ordered 12/04 Episode of severe hyperkalemia with widening complex >> responded to Ca2+, HCO3 12/04 increased bilateral PTX - bilateral anterior chest tubes placed with resolution 12/04 FOB - extensive mucus plugging 12/05 Much improved vent mechanics. Nimbex and propofol continued 12/05 No air leak on any of the chest tube chambers 12/05 Anisocoria resolved - hypertonic saline discontinued 12/05 HCO3 gtt discontinued 12/05 EEG: general background slowing. No epileptiform activity was noted. Findings consistent with current sedation.  12/06: NMBs D/C'd 12/07 tolerates PS 10 - 14  LINES/TUBES: R radial A line 12/04 >> 12/05 L anterior chest tube 12/04 >> 12/07 R anterior chest tube 12/04 >> 12/07 ETT 12/04 >>  R [place] CVL 12/04 >>  L radial A-line 12/05 >>  L chest tube 12/04 >>  R chest tube 12/04 >>   MICRO:  MRSA PCR 12/04 >> NEG Urine 12/05 >> NEG Resp 12/05 >> NOF Blood 12/05 >>   ABX:  Ceftrx 12/04 >>   PROPHYLAXIS: DVT: SQ heparin SUP: H2RB  CONSULTANTS:  Neuro Marland KitchenReynolds) 12/04 >>    Filed Vitals:   02/17/12 0904 02/17/12 1000 02/17/12 1100 02/17/12 1200  BP:  147/98 129/83 122/73  Pulse: 57 85 50 50  Temp:    97.8 F (36.6 C)  TempSrc:    Axillary  Resp: 23 34 19 15  Height:      Weight:      SpO2: 98% 96% 93%     EXAM:  Gen: Intubated, sedated HEENT: NCAT Neck: No JVD Lungs: scattered diffuse wheezes, no air leak on any chest tubes Cardiovascular: RRR s M, 2+ DP  pulses Abdomen: Obese, soft, NT, diminished BS Ext: No edema, warm Neuro: pupils equal and react, MAEs Skin: WNL  DATA: CBC    Component Value Date/Time   WBC 14.3* 02/17/2012 0549   RBC 4.33 02/17/2012 0549   HGB 12.0 02/17/2012 0549   HCT 38.6 02/17/2012 0549   PLT 279 02/17/2012 0549   MCV 89.1 02/17/2012 0549   MCH 27.7 02/17/2012 0549   MCHC 31.1 02/17/2012 0549   RDW 12.9 02/17/2012 0549   LYMPHSABS 7.4* 02/14/2012 0752   MONOABS 1.6* 02/14/2012 0752   EOSABS 0.9* 02/14/2012 0752   BASOSABS 0.2* 02/14/2012 0752    BMET    Component Value Date/Time   NA 145 02/17/2012 0549   K 4.2 02/17/2012 0549   CL 107 02/17/2012 0549   CO2 31 02/17/2012 0549   GLUCOSE 156* 02/17/2012 0549   BUN 26* 02/17/2012 0549   CREATININE 0.76 02/17/2012 0549   CALCIUM 9.2 02/17/2012 0549   GFRNONAA >90 02/17/2012 0549   GFRAA >90 02/17/2012 0549    ABG    Component Value Date/Time   PHART 7.363 02/15/2012 1500   PCO2ART 57.5* 02/15/2012 1500   PO2ART 69.2* 02/15/2012 1500   HCO3 31.9* 02/15/2012 1500   TCO2 29.4 02/15/2012 1500   ACIDBASEDEF 2.1* 02/14/2012 2321   O2SAT 95.4 02/15/2012 1500    CXR: mild edema pattern, no PTX   IMPRESSION:   Principal Problem:  *  Status asthmaticus Active Problems:  Pulmonary edema vs mild ALI  Acute respiratory failure with hypercapnia  Acute encephalopathy, hypoxic and hypercarbic  Anisocoria, resolved 12/05  ? Hypercarbic cerebral edema  Tension pneumothorax, spontaneous  Hyperkalemia  Steroid-induced hyperglycemia  Panic attacks, history of  Not quite ready for extubation but dramatically better   PLAN:  Wean in PSV mode as tolerated Cont Propofol Change fentanyl to PRN Cont systemic steroids Cont Fluticasone MDI Cont Scheduled and PRN bronchodilators Cont Empiric SSI in setting of systemic steroids Cont chest tubes on water seal Cont TFs Lasix X 1 Monitor CXR Family has been updated in detail   30 mins CCM time   Billy Fischer, MD ; [facility]  service Mobile [phone].  After 5:30 PM or weekends, call [phone]

## 2012-02-17 NOTE — Progress Notes (Signed)
Pt has some blood in urine, will continue to monitor.  MD notified. [name]

## 2012-02-17 NOTE — Progress Notes (Signed)
Chest tubes pulled bilateral per MD orders (anterior).  Pt tol. Procedure well. [name]

## 2012-02-17 NOTE — Progress Notes (Signed)
Pt has some blood in urine, MD aware, will continue to monitor and report.  Alexandra Thornton

## 2012-02-17 NOTE — Progress Notes (Signed)
MD called d/t pt increased peak pressures, will continue to monitor and report.  Alexandra Thornton

## 2012-02-17 NOTE — Progress Notes (Signed)
Pt has some EKG changes (strip in chart) E-link MD called and made aware, 12 LEAD EKG faxed to E-link MD as well. RT to get ABG per MD orders.  Conley Rolls

## 2012-02-18 ENCOUNTER — Inpatient Hospital Stay (HOSPITAL_COMMUNITY): Payer: BC Managed Care – PPO

## 2012-02-18 LAB — GLUCOSE, CAPILLARY
Glucose-Capillary: 154 mg/dL — ABNORMAL HIGH (ref 70–99)
Glucose-Capillary: 142 mg/dL — ABNORMAL HIGH (ref 70–99)

## 2012-02-18 LAB — BASIC METABOLIC PANEL
BUN: 27 mg/dL — ABNORMAL HIGH (ref 6–23)
Creatinine, Ser: 0.69 mg/dL (ref 0.50–1.10)
GFR calc non Af Amer: >90 mL/min (ref >90)
Glucose, Bld: 162 mg/dL — ABNORMAL HIGH (ref 70–99)
Potassium: 3.9 mEq/L (ref 3.5–5.1)

## 2012-02-18 MED ORDER — METHYLPREDNISOLONE SODIUM SUCC 125 MG IJ SOLR
80.0000 mg | Freq: Two times a day (BID) | INTRAMUSCULAR | Status: DC
  Administered 2012-02-18: 80 mg via INTRAVENOUS
  Administered 2012-02-19: 05:00:00 via INTRAVENOUS
  Filled 2012-02-18 (×4): qty 1.28

## 2012-02-18 MED ORDER — IPRATROPIUM BROMIDE 0.02 % IN SOLN
0.5000 mg | Freq: Four times a day (QID) | RESPIRATORY_TRACT | Status: DC
  Administered 2012-02-18 – 2012-02-23 (×19): 0.5 mg via RESPIRATORY_TRACT
  Filled 2012-02-18 (×19): qty 2.5

## 2012-02-18 MED ORDER — ALBUTEROL SULFATE (5 MG/ML) 0.5% IN NEBU
2.5000 mg | INHALATION_SOLUTION | Freq: Four times a day (QID) | RESPIRATORY_TRACT | Status: DC
  Administered 2012-02-18 – 2012-02-23 (×19): 2.5 mg via RESPIRATORY_TRACT
  Filled 2012-02-18 (×18): qty 0.5

## 2012-02-18 MED ORDER — BUDESONIDE 0.5 MG/2ML IN SUSP
0.5000 mg | Freq: Two times a day (BID) | RESPIRATORY_TRACT | Status: DC
  Administered 2012-02-18 – 2012-02-23 (×10): 0.5 mg via RESPIRATORY_TRACT
  Filled 2012-02-18 (×13): qty 2

## 2012-02-18 MED ORDER — ALBUTEROL SULFATE (5 MG/ML) 0.5% IN NEBU
2.5000 mg | INHALATION_SOLUTION | RESPIRATORY_TRACT | Status: DC | PRN
  Filled 2012-02-18 (×3): qty 0.5

## 2012-02-18 MED ORDER — FUROSEMIDE 10 MG/ML IJ SOLN
40.0000 mg | Freq: Once | INTRAMUSCULAR | Status: AC
  Administered 2012-02-18: 40 mg via INTRAVENOUS
  Filled 2012-02-18: qty 4

## 2012-02-18 MED ORDER — FENTANYL CITRATE 0.05 MG/ML IJ SOLN
12.5000 ug | INTRAMUSCULAR | Status: DC | PRN
  Administered 2012-02-19: 12.5 ug via INTRAVENOUS
  Administered 2012-02-19: 25 ug via INTRAVENOUS
  Administered 2012-02-19: 12.5 ug via INTRAVENOUS
  Administered 2012-02-20 (×3): 25 ug via INTRAVENOUS
  Filled 2012-02-18 (×2): qty 2

## 2012-02-18 MED ORDER — DEXMEDETOMIDINE HCL IN NACL 200 MCG/50ML IV SOLN
0.4000 ug/kg/h | INTRAVENOUS | Status: DC
  Filled 2012-02-18: qty 50

## 2012-02-18 MED ORDER — PROPOFOL 10 MG/ML IV EMUL
5.0000 ug/kg/min | INTRAVENOUS | Status: DC
  Administered 2012-02-18: 35 ug/kg/min via INTRAVENOUS
  Administered 2012-02-18: 40 ug/kg/min via INTRAVENOUS
  Filled 2012-02-18 (×2): qty 100

## 2012-02-18 MED ORDER — DEXMEDETOMIDINE HCL IN NACL 400 MCG/100ML IV SOLN
0.4000 ug/kg/h | INTRAVENOUS | Status: DC
  Administered 2012-02-18 – 2012-02-19 (×3): 0.4 ug/kg/h via INTRAVENOUS
  Filled 2012-02-18 (×4): qty 100

## 2012-02-18 MED ORDER — INSULIN ASPART 100 UNIT/ML ~~LOC~~ SOLN
0.0000 [IU] | Freq: Four times a day (QID) | SUBCUTANEOUS | Status: DC
  Administered 2012-02-18 (×2): 2 [IU] via SUBCUTANEOUS
  Administered 2012-02-18: 3 [IU] via SUBCUTANEOUS
  Administered 2012-02-19 – 2012-02-21 (×4): 2 [IU] via SUBCUTANEOUS

## 2012-02-18 MED ORDER — POTASSIUM CHLORIDE 10 MEQ/50ML IV SOLN
10.0000 meq | INTRAVENOUS | Status: AC
  Administered 2012-02-18 (×4): 10 meq via INTRAVENOUS
  Filled 2012-02-18: qty 200

## 2012-02-18 NOTE — Progress Notes (Signed)
PCCM NOTE  Date of admission: 12/04  Pt Profile: 89 F [name] presented to [facility] ED via EMS with severe respiratory distress and intubated for severe hypercarbia due to status asthmaticus  SIGNIFICANT EVENTS/STUDIES: 12/04 Very difficult to ventilate - Nimbex, propofol 12/04 Anisocoria noted 12/04 CT head: Question cerebral edema  12/04 hypertonic saline protocol initiated 12/04 Neuro Consult - 3% NaCl, MRI ordered (too unstable to travel), EEG ordered 12/04 Episode of severe hyperkalemia with widening complex >> responded to Ca2+, HCO3 12/04 increased bilateral PTX - bilateral anterior chest tubes placed with resolution 12/04 FOB - extensive mucus plugging 12/05 Much improved vent mechanics. Nimbex and propofol continued 12/05 No air leak on any of the chest tube chambers 12/05 Anisocoria resolved - hypertonic saline discontinued 12/05 HCO3 gtt discontinued 12/05 EEG: general background slowing. No epileptiform activity was noted. Findings consistent with current sedation.  12/06: NMBs D/C'd 12/07 tolerates PS 10 - 14 12/08 Extubated  LINES/TUBES: R radial A line 12/04 >> 12/05 L anterior chest tube 12/04 >> 12/07 R anterior chest tube 12/04 >> 12/07 ETT 12/04 >> 12/08  R [place] CVL 12/04 >>  L radial A-line 12/05 >>  L chest tube 12/04 >>  R chest tube 12/04 >>   MICRO:  MRSA PCR 12/04 >> NEG Urine 12/05 >> NEG Resp 12/05 >> NOF Blood 12/05 >>   ABX:  Ceftrx 12/04 X 1 dose  PROPHYLAXIS: DVT: SQ heparin SUP: H2RB > D/C'd after extubation  CONSULTANTS:  Neuro [name]) 12/04 >>    Filed Vitals:   02/18/12 0600 02/18/12 0800 02/18/12 0815 02/18/12 0900  BP: 135/89 130/89  136/88  Pulse: 54 53  54  Temp:  98.9 F (37.2 C)    TempSrc:  Axillary    Resp: 14 14  14   Height:      Weight:      SpO2: 96% 96% 96% 96%    EXAM:  Gen: RASS -1, not F/C HEENT: NCAT Neck: No JVD Lungs: No wheezes no air leak on any chest  tubes Cardiovascular: RRR s M, 2+ DP pulses Abdomen: Obese, soft, NT, diminished BS Ext: No edema, warm Neuro: pupils equal and react, MAEs Skin: WNL  DATA: CBC    Component Value Date/Time   WBC 14.3* 02/17/2012 0549   RBC 4.33 02/17/2012 0549   HGB 12.0 02/17/2012 0549   HCT 38.6 02/17/2012 0549   PLT 279 02/17/2012 0549   MCV 89.1 02/17/2012 0549   MCH 27.7 02/17/2012 0549   MCHC 31.1 02/17/2012 0549   RDW 12.9 02/17/2012 0549   LYMPHSABS 7.4* 02/14/2012 0752   MONOABS 1.6* 02/14/2012 0752   EOSABS 0.9* 02/14/2012 0752   BASOSABS 0.2* 02/14/2012 0752    BMET    Component Value Date/Time   NA 143 02/18/2012 0650   K 3.9 02/18/2012 0650   CL 103 02/18/2012 0650   CO2 30 02/18/2012 0650   GLUCOSE 162* 02/18/2012 0650   BUN 27* 02/18/2012 0650   CREATININE 0.69 02/18/2012 0650   CALCIUM 8.9 02/18/2012 0650   GFRNONAA >90 02/18/2012 0650   GFRAA >90 02/18/2012 0650    ABG    Component Value Date/Time   PHART 7.494* 02/17/2012 2110   PCO2ART 41.5 02/17/2012 2110   PO2ART 82.5 02/17/2012 2110   HCO3 31.7* 02/17/2012 2110   TCO2 27.7 02/17/2012 2110   ACIDBASEDEF 2.1* 02/14/2012 2321   O2SAT 97.6 02/17/2012 2110    CXR: mild edema pattern,  NSC, no ptx   IMPRESSION:   Principal Problem:  *Status asthmaticus - resolved Active Problems:  Pulmonary edema vs mild ALI  Acute respiratory failure  Acute encephalopathy, hypoxic and hypercarbic  Anisocoria, resolved 12/05  ? Hypercarbic cerebral edema  Tension pneumothorax, spontaneous  Hyperkalemia, resolved  Steroid-induced hyperglycemia  Panic attacks, history of  Looks good post extubation. Remains encephalopathic   PLAN:  Monitor resp status in ICU post extubation D/C propofol Cont PRN fentanyl Taper systemic steroids Change MDIs to nebulized form post extubation Cont Empiric SSI in setting of systemic steroids - dose adjusted for NPO status Cont chest tubes on water seal Repeat Lasix Monitor CXR for resolution of edema vs  ALI Mother has been updated in detail   30 mins CCM time   Billy Fischer, MD ; [facility] service Mobile [phone].  After 5:30 PM or weekends, call [phone]

## 2012-02-18 NOTE — Accreditation Note (Signed)
F/U with E-link MD r/t pt 12 LEAD EKG and ABG, no new orders at this time.

## 2012-02-18 NOTE — Progress Notes (Signed)
Pt extubated at 0945 to 4L [place]. Propofol turned off 0950. Pt became restless and agitated but still unresponsive to commands. Dr. Sung Amabile ordered Precedex to be started. Initiated Precdex at 11:40 at 0.4 mcg/kg/hr. Pt responded favorably and appears to be resting comfortably. Pt received 40 lasix and has put out app. 2.5 L since. BP still high.Will continue to monitor.

## 2012-02-18 NOTE — Progress Notes (Signed)
Subjective: Patient extubated today.  Initially the patient was on no sedation but per nursing was agitated and not following commands.  Precidex has since been started.    Objective: Current vital signs: BP 147/101  Pulse 51  Temp 99.4 F (37.4 C) (Axillary)  Resp 14  Ht 5\' 4"  (1.626 m)  Wt 106 kg (233 lb 11 oz)  BMI 40.11 kg/m2  SpO2 98% Vital signs in last 24 hours: Temp:  [98.6 F (37 C)-99.8 F (37.7 C)] 99.4 F (37.4 C) (12/08 1200) Pulse Rate:  [48-91] 51  (12/08 1400) Resp:  [13-20] 14  (12/08 1400) BP: (127-158)/(75-105) 147/101 mmHg (12/08 1400) SpO2:  [96 %-99 %] 98 % (12/08 1403) Arterial Line BP: (136-193)/(103-128) 187/107 mmHg (12/08 0800) FiO2 (%):  [30 %] 30 % (12/08 0908) Weight:  [106 kg (233 lb 11 oz)] 106 kg (233 lb 11 oz) (12/08 0100)  Intake/Output from previous day: 12/07 0701 - 12/08 0700 In: 1946.3 [I.V.:1452.3; NG/GT:440; IV Piggyback:54] Out: 2955 [Urine:2805; Chest Tube:150] Intake/Output this shift: Total I/O In: 707.3 [I.V.:323.3; NG/GT:80; IV Piggyback:304] Out: 2650 [Urine:2650] Nutritional status: NPO  Neurologic Exam: Mental Status: Patient does not respond to verbal stimuli.  Does not respond to deep sternal rub.  Does not follow commands.  No verbalizations noted.  Cranial Nerves: II: patient does not respond confrontation bilaterally, pupils right 4 mm, left 4 mm,and reactive bilaterally III,IV,VI: doll's response present bilaterally.  V,VII: corneal reflex reduced bilaterally  VIII: patient does not respond to verbal stimuli IX,X: gag reflex not tested, XI: trapezius strength unable to test bilaterally XII: tongue strength unable to test Motor: Extremities flaccid throughout.  No spontaneous movement noted.  No purposeful movements noted. Sensory: Does not respond to noxious stimuli in any extremity. Deep Tendon Reflexes:  Absent throughout. Plantars: mute bilaterally Cerebellar: Unable to perform    Lab  Results: Basic Metabolic Panel:  Lab 02/18/12 0454 02/17/12 0549 02/16/12 1626 02/16/12 0422 02/15/12 0955 02/15/12 0400  NA 143 145 148* 148* 151* --  K 3.9 4.2 3.6 3.5 -- 3.8  CL 103 107 109 111 -- 111  CO2 30 31 32 34* -- 28  GLUCOSE 162* 156* 157* 143* -- 142*  BUN 27* 26* 20 14 -- 9  CREATININE 0.69 0.76 0.72 0.69 -- 0.94  CALCIUM 8.9 9.2 8.9 -- -- --  MG -- -- -- -- -- --  PHOS -- -- -- -- -- --    Liver Function Tests: No results found for this basename: AST:5,ALT:5,ALKPHOS:5,BILITOT:5,PROT:5,ALBUMIN:5 in the last 168 hours No results found for this basename: LIPASE:5,AMYLASE:5 in the last 168 hours No results found for this basename: AMMONIA:3 in the last 168 hours  CBC:  Lab 02/17/12 0549 02/16/12 0422 02/15/12 0400 02/14/12 0752  WBC 14.3* 13.3* 17.9* 22.5*  NEUTROABS -- -- -- 12.4*  HGB 12.0 11.1* 12.8 13.9  HCT 38.6 34.9* 39.5 41.5  MCV 89.1 89.5 87.8 85.6  PLT 279 240 298 PLATELET CLUMPS NOTED ON SMEAR, COUNT APPEARS ADEQUATE    Cardiac Enzymes:  Lab 02/14/12 0752  CKTOTAL --  CKMB --  CKMBINDEX --  TROPONINI <0.30    Lipid Panel:  Lab 02/17/12 0549  CHOL --  TRIG 280*  HDL --  CHOLHDL --  VLDL --  LDLCALC --    CBG:  Lab 02/18/12 0557 02/18/12 0003 02/17/12 1742 02/17/12 1208 02/17/12 0549  GLUCAP 165* 159* 174* 163* 156*    Microbiology: Results for orders placed during the hospital encounter of 02/14/12  MRSA PCR SCREENING     Status: Normal   Collection Time   02/14/12  3:18 PM      Component Value Range Status Comment   MRSA by PCR NEGATIVE  NEGATIVE Final   URINE CULTURE     Status: Normal   Collection Time   02/15/12  4:51 AM      Component Value Range Status Comment   Specimen Description URINE, CATHETERIZED   Final    Special Requests Normal   Final    Culture  Setup Time 02/15/2012 09:25   Final    Colony Count NO GROWTH   Final    Culture NO GROWTH   Final    Report Status 02/16/2012 FINAL   Final   CULTURE, RESPIRATORY      Status: Normal   Collection Time   02/15/12  4:56 AM      Component Value Range Status Comment   Specimen Description TRACHEAL ASPIRATE   Final    Special Requests NONE   Final    Gram Stain     Final    Value: ABUNDANT WBC PRESENT, PREDOMINANTLY PMN     RARE SQUAMOUS EPITHELIAL CELLS PRESENT     RARE GRAM POSITIVE COCCI     IN PAIRS IN CHAINS   Culture Non-Pathogenic Oropharyngeal-type Flora Isolated.   Final    Report Status 02/17/2012 FINAL   Final   CULTURE, BLOOD (ROUTINE X 2)     Status: Normal (Preliminary result)   Collection Time   02/15/12  5:15 AM      Component Value Range Status Comment   Specimen Description BLOOD LEFT FOOT   Final    Special Requests BOTTLES DRAWN AEROBIC AND ANAEROBIC 5CC   Final    Culture  Setup Time 02/15/2012 09:23   Final    Culture     Final    Value:        BLOOD CULTURE RECEIVED NO GROWTH TO DATE CULTURE WILL BE HELD FOR 5 DAYS BEFORE ISSUING A FINAL NEGATIVE REPORT   Report Status PENDING   Incomplete   CULTURE, BLOOD (ROUTINE X 2)     Status: Normal (Preliminary result)   Collection Time   02/15/12  5:20 AM      Component Value Range Status Comment   Specimen Description BLOOD RIGHT FOOT   Final    Special Requests BOTTLES DRAWN AEROBIC AND ANAEROBIC 5CC   Final    Culture  Setup Time 02/15/2012 09:23   Final    Culture     Final    Value:        BLOOD CULTURE RECEIVED NO GROWTH TO DATE CULTURE WILL BE HELD FOR 5 DAYS BEFORE ISSUING A FINAL NEGATIVE REPORT   Report Status PENDING   Incomplete     Coagulation Studies: No results found for this basename: LABPROT:5,INR:5 in the last 72 hours  Imaging: Dg Chest Port 1 View  02/18/2012  *RADIOLOGY REPORT*  Clinical Data: Respiratory failure.  Intubated with chest tubes.  PORTABLE CHEST - 1 VIEW  Comparison: 02/17/2012  Findings: No pneumothorax.  Bilateral airspace opacities appear improved, but this is likely due to larger lung volumes.  No new lung opacities.  No pleural effusion.   Bilateral chest tubes, and endotracheal tube, a nasogastric tube and a right subclavian central venous line are all stable well- positioned.  Left anterior-lateral chest wall subcutaneous emphysema appears mildly increased from the previous day's study.  IMPRESSION: Mild increase in left anterior-lateral  chest wall subcutaneous emphysema.  Airspace lung opacities described previously are less prominent, although this may be technical change only due to larger lung volumes.  No other evidence of a change from the prior exam. Support apparatus remains well positioned.   Original Report Authenticated By: Amie Portland, M.D.    Dg Chest Port 1 View  02/17/2012  *RADIOLOGY REPORT*  Clinical Data: Respiratory failure.  PORTABLE CHEST - 1 VIEW  Comparison: 02/16/2012  Findings: Support devices are unchanged.  Bilateral chest tubes. No visible pneumothorax.  Low lung volumes with bilateral airspace opacities, unchanged.  No visible effusion.  IMPRESSION: No significant change.  No pneumothorax.   Original Report Authenticated By: Charlett Nose, M.D.    Dg Chest Port 1 View  02/17/2012  *RADIOLOGY REPORT*  Clinical Data: Status post bilateral chest tube removal.  PORTABLE CHEST - 1 VIEW  Comparison: Earlier today.  Findings: One of the previously seen chest tubes on each side have been removed.  A single remaining chest tube is present on each side.  No pneumothorax.  The endotracheal tube tip is 9 mm above the carina.  Stable enlargement of the cardiac silhouette and prominence of the pulmonary vasculature.  A small amount of subcutaneous emphysema is noted on the left.  Nasogastric tube tip in the mid stomach.  Unremarkable bones.  IMPRESSION:  1.  No pneumothorax following removal of one of the chest tubes on each side. 2.  Endotracheal tube tip 9 mm above the carina.  It is recommended that this be retracted 4 cm. 3.  Stable cardiomegaly and pulmonary vascular congestion.   Original Report Authenticated By: Beckie Salts, M.D.     Medications:  I have reviewed the patient's current medications. Scheduled:   . albuterol  2.5 mg Nebulization Q6H  . antiseptic oral rinse  15 mL Mouth Rinse QID  . budesonide  0.5 mg Nebulization BID  . chlorhexidine  15 mL Mouth Rinse BID  . [COMPLETED] furosemide  40 mg Intravenous Once  . heparin  5,000 Units Subcutaneous Q8H  . insulin aspart  0-15 Units Subcutaneous Q6H  . ipratropium  0.5 mg Nebulization Q6H  . methylPREDNISolone (SOLU-MEDROL) injection  80 mg Intravenous Q12H  . potassium chloride  10 mEq Intravenous Q1 Hr x 4  . [DISCONTINUED] artificial tears   Both Eyes Q8H  . [DISCONTINUED] famotidine (PEPCID) IV  20 mg Intravenous Q12H  . [DISCONTINUED] fluticasone  4 puff Inhalation BID  . [DISCONTINUED] insulin aspart  0-20 Units Subcutaneous Q6H  . [DISCONTINUED] ipratropium  8 puff Inhalation Q6H  . [DISCONTINUED] levalbuterol  8 puff Inhalation Q2H  . [DISCONTINUED] methylPREDNISolone (SOLU-MEDROL) injection  80 mg Intravenous Q8H    Assessment/Plan:  Patient Active Hospital Problem List:  ? Hypercarbic cerebral edema (02/14/2012)   Assessment: Now that patient is extubated and she has not returned to her baseline mental status, feel that it is necessary to further investigate the abnormal head CT findings that were seen on the day of admission.    Plan:  1. MRI of the brain without contrast.   Case discussed with mother.    LOS: 4 days   Thana Farr, MD [facility] [phone] 02/18/2012  2:44 PM

## 2012-02-19 ENCOUNTER — Inpatient Hospital Stay (HOSPITAL_COMMUNITY): Payer: BC Managed Care – PPO

## 2012-02-19 DIAGNOSIS — G936 Cerebral edema: Secondary | ICD-10-CM

## 2012-02-19 DIAGNOSIS — J811 Chronic pulmonary edema: Secondary | ICD-10-CM

## 2012-02-19 LAB — BLOOD GAS, ARTERIAL
Bicarbonate: 32.5 mEq/L — ABNORMAL HIGH (ref 20.0–24.0)
O2 Saturation: 98.1 %
PEEP: 5 cmH2O
TCO2: 29.5 mmol/L (ref 0–100)
pO2, Arterial: 95.1 mmHg (ref 80.0–100.0)

## 2012-02-19 LAB — BASIC METABOLIC PANEL
BUN: 23 mg/dL (ref 6–23)
Chloride: 100 mEq/L (ref 96–112)
GFR calc Af Amer: >90 mL/min (ref >90)
Potassium: 3.7 mEq/L (ref 3.5–5.1)

## 2012-02-19 LAB — GLUCOSE, CAPILLARY: Glucose-Capillary: 113 mg/dL — ABNORMAL HIGH (ref 70–99)

## 2012-02-19 MED ORDER — POTASSIUM CHLORIDE CRYS ER 20 MEQ PO TBCR
40.0000 meq | EXTENDED_RELEASE_TABLET | Freq: Once | ORAL | Status: AC
  Administered 2012-02-19: 40 meq via ORAL
  Filled 2012-02-19: qty 2

## 2012-02-19 MED ORDER — METHYLPREDNISOLONE SODIUM SUCC 40 MG IJ SOLR
40.0000 mg | Freq: Two times a day (BID) | INTRAMUSCULAR | Status: DC
  Administered 2012-02-19 – 2012-02-21 (×4): 40 mg via INTRAVENOUS
  Filled 2012-02-19 (×4): qty 1

## 2012-02-19 NOTE — Progress Notes (Signed)
Subjective: Patient much improved.  OOB in recliner.  Alert and oriented.  Objective: Current vital signs: BP 119/58  Pulse 78  Temp 98.5 F (36.9 C) (Oral)  Resp 18  Ht 5\' 4"  (1.626 m)  Wt 106 kg (233 lb 11 oz)  BMI 40.11 kg/m2  SpO2 99% Vital signs in last 24 hours: Temp:  [97.5 F (36.4 C)-99.9 F (37.7 C)] 98.5 F (36.9 C) (12/09 0800) Pulse Rate:  [42-135] 78  (12/09 1100) Resp:  [12-19] 18  (12/09 1100) BP: (116-179)/(58-101) 119/58 mmHg (12/09 1100) SpO2:  [94 %-100 %] 99 % (12/09 1100)  Intake/Output from previous day: 12/08 0701 - 12/09 0700 In: 1168.1 [I.V.:784.1; NG/GT:80; IV Piggyback:304] Out: 4225 [Urine:4075; Chest Tube:150] Intake/Output this shift: Total I/O In: 210.6 [I.V.:210.6] Out: 1500 [Urine:1500] Nutritional status: General  Neurologic Exam: Mental Status: Alert, oriented, thought content appropriate.  Able to do simple calculations such as 5+2 but unable to do more complicated addition such as 12+9.  Speech fluent without evidence of aphasia.  Able to follow 3 step commands without difficulty. Cranial Nerves: II: Discs flat bilaterally; Visual fields grossly normal, pupils equal, round, reactive to light and accommodation III,IV, VI: ptosis not present, extra-ocular motions intact bilaterally V,VII: smile symmetric, facial light touch sensation normal bilaterally VIII: hearing normal bilaterally IX,X: gag reflex present XI: bilateral shoulder shrug XII: midline tongue extension Motor: Right : Upper extremity   5/5 (limited by pain)    Left:     Upper extremity   5/5 (limited by pain)  Lower extremity   5/5        Lower extremity   5/5 Tone and bulk:normal tone throughout; no atrophy noted Sensory: Pinprick and light touch intact throughout, bilaterally Deep Tendon Reflexes: 1+ and throughout Plantars: Right: mute   Left: mute   Lab Results: Basic Metabolic Panel:  Lab 02/19/12 2956 02/18/12 0650 02/17/12 0549 02/16/12 1626 02/16/12  0422  NA 138 143 145 148* 148*  K 3.7 3.9 4.2 3.6 3.5  CL 100 103 107 109 111  CO2 29 30 31  32 34*  GLUCOSE 141* 162* 156* 157* 143*  BUN 23 27* 26* 20 14  CREATININE 0.64 0.69 0.76 0.72 0.69  CALCIUM 8.7 8.9 9.2 -- --  MG -- -- -- -- --  PHOS -- -- -- -- --    Liver Function Tests: No results found for this basename: AST:5,ALT:5,ALKPHOS:5,BILITOT:5,PROT:5,ALBUMIN:5 in the last 168 hours No results found for this basename: LIPASE:5,AMYLASE:5 in the last 168 hours No results found for this basename: AMMONIA:3 in the last 168 hours  CBC:  Lab 02/17/12 0549 02/16/12 0422 02/15/12 0400 02/14/12 0752  WBC 14.3* 13.3* 17.9* 22.5*  NEUTROABS -- -- -- 12.4*  HGB 12.0 11.1* 12.8 13.9  HCT 38.6 34.9* 39.5 41.5  MCV 89.1 89.5 87.8 85.6  PLT 279 240 298 PLATELET CLUMPS NOTED ON SMEAR, COUNT APPEARS ADEQUATE    Cardiac Enzymes:  Lab 02/14/12 0752  CKTOTAL --  CKMB --  CKMBINDEX --  TROPONINI <0.30    Lipid Panel:  Lab 02/17/12 0549  CHOL --  TRIG 280*  HDL --  CHOLHDL --  VLDL --  LDLCALC --    CBG:  Lab 02/19/12 1033 02/19/12 0400 02/18/12 2216 02/18/12 1547 02/18/12 1152  GLUCAP 143* 145* 142* 141* 154*    Microbiology: Results for orders placed during the hospital encounter of 02/14/12  MRSA PCR SCREENING     Status: Normal   Collection Time   02/14/12  3:18 PM      Component Value Range Status Comment   MRSA by PCR NEGATIVE  NEGATIVE Final   URINE CULTURE     Status: Normal   Collection Time   02/15/12  4:51 AM      Component Value Range Status Comment   Specimen Description URINE, CATHETERIZED   Final    Special Requests Normal   Final    Culture  Setup Time 02/15/2012 09:25   Final    Colony Count NO GROWTH   Final    Culture NO GROWTH   Final    Report Status 02/16/2012 FINAL   Final   CULTURE, RESPIRATORY     Status: Normal   Collection Time   02/15/12  4:56 AM      Component Value Range Status Comment   Specimen Description TRACHEAL ASPIRATE    Final    Special Requests NONE   Final    Gram Stain     Final    Value: ABUNDANT WBC PRESENT, PREDOMINANTLY PMN     RARE SQUAMOUS EPITHELIAL CELLS PRESENT     RARE GRAM POSITIVE COCCI     IN PAIRS IN CHAINS   Culture Non-Pathogenic Oropharyngeal-type Flora Isolated.   Final    Report Status 02/17/2012 FINAL   Final   CULTURE, BLOOD (ROUTINE X 2)     Status: Normal (Preliminary result)   Collection Time   02/15/12  5:15 AM      Component Value Range Status Comment   Specimen Description BLOOD LEFT FOOT   Final    Special Requests BOTTLES DRAWN AEROBIC AND ANAEROBIC 5CC   Final    Culture  Setup Time 02/15/2012 09:23   Final    Culture     Final    Value:        BLOOD CULTURE RECEIVED NO GROWTH TO DATE CULTURE WILL BE HELD FOR 5 DAYS BEFORE ISSUING A FINAL NEGATIVE REPORT   Report Status PENDING   Incomplete   CULTURE, BLOOD (ROUTINE X 2)     Status: Normal (Preliminary result)   Collection Time   02/15/12  5:20 AM      Component Value Range Status Comment   Specimen Description BLOOD RIGHT FOOT   Final    Special Requests BOTTLES DRAWN AEROBIC AND ANAEROBIC 5CC   Final    Culture  Setup Time 02/15/2012 09:23   Final    Culture     Final    Value:        BLOOD CULTURE RECEIVED NO GROWTH TO DATE CULTURE WILL BE HELD FOR 5 DAYS BEFORE ISSUING A FINAL NEGATIVE REPORT   Report Status PENDING   Incomplete     Coagulation Studies: No results found for this basename: LABPROT:5,INR:5 in the last 72 hours  Imaging: Dg Chest Port 1 View  02/19/2012  *RADIOLOGY REPORT*  Clinical Data: Respiratory failure  PORTABLE CHEST - 1 VIEW  Comparison: Chest radiograph 02/18/2012  Findings: Interval removal of NG tube and endotracheal tube. Right central venous line unchanged.  Two chest tubes remain without evidence of pneumothorax.  Stable cardiac silhouette.  Low lung volumes present.  Central venous congestion.  IMPRESSION:  1.  Interval extubation without complication. 2.  Two chest tubes in place  without pneumothorax.  3.  Low lung volumes and central venous pulmonary congestion.   Original Report Authenticated By: Genevive Bi, M.D.    Dg Chest Port 1 View  02/18/2012  *RADIOLOGY REPORT*  Clinical Data: Respiratory  failure.  Intubated with chest tubes.  PORTABLE CHEST - 1 VIEW  Comparison: 02/17/2012  Findings: No pneumothorax.  Bilateral airspace opacities appear improved, but this is likely due to larger lung volumes.  No new lung opacities.  No pleural effusion.  Bilateral chest tubes, and endotracheal tube, a nasogastric tube and a right subclavian central venous line are all stable well- positioned.  Left anterior-lateral chest wall subcutaneous emphysema appears mildly increased from the previous day's study.  IMPRESSION: Mild increase in left anterior-lateral chest wall subcutaneous emphysema.  Airspace lung opacities described previously are less prominent, although this may be technical change only due to larger lung volumes.  No other evidence of a change from the prior exam. Support apparatus remains well positioned.   Original Report Authenticated By: Amie Portland, M.D.     Medications:  I have reviewed the patient's current medications. Scheduled:   . albuterol  2.5 mg Nebulization Q6H  . budesonide  0.5 mg Nebulization BID  . heparin  5,000 Units Subcutaneous Q8H  . insulin aspart  0-15 Units Subcutaneous Q6H  . ipratropium  0.5 mg Nebulization Q6H  . methylPREDNISolone (SOLU-MEDROL) injection  40 mg Intravenous Q12H  . [COMPLETED] potassium chloride  10 mEq Intravenous Q1 Hr x 4  . [COMPLETED] potassium chloride  40 mEq Oral Once  . [DISCONTINUED] antiseptic oral rinse  15 mL Mouth Rinse QID  . [DISCONTINUED] chlorhexidine  15 mL Mouth Rinse BID  . [DISCONTINUED] methylPREDNISolone (SOLU-MEDROL) injection  80 mg Intravenous Q12H    Assessment/Plan:  Patient Active Hospital Problem List: ? Hypercarbic cerebral edema (02/14/2012)   Assessment: Patient much improved.   At this time does not show evidence of anoxic brain injury clinically.   Plan:  1.  No further neurologic testing recommended at this time.  Agree with cancellation of MRI.   LOS: 5 days   Thana Farr, MD [facility] [phone] 02/19/2012  1:01 PM

## 2012-02-19 NOTE — Progress Notes (Signed)
PCCM NOTE  Date of admission: 12/04  Pt Profile: 42 F [name] presented to [facility] ED via EMS with severe respiratory distress and intubated for severe hypercarbia due to status asthmaticus  SIGNIFICANT EVENTS/STUDIES: 12/04 Very difficult to ventilate - Nimbex, propofol 12/04 Anisocoria noted 12/04 CT head: Question cerebral edema  12/04 hypertonic saline protocol initiated 12/04 Neuro Consult - 3% NaCl, MRI ordered (too unstable to travel), EEG ordered 12/04 Episode of severe hyperkalemia with widening complex >> responded to Ca2+, HCO3 12/04 increased bilateral PTX - bilateral anterior chest tubes placed with resolution 12/04 FOB - extensive mucus plugging 12/05 Much improved vent mechanics. Nimbex and propofol continued 12/05 No air leak on any of the chest tube chambers 12/05 Anisocoria resolved - hypertonic saline discontinued 12/05 HCO3 gtt discontinued 12/05 EEG: general background slowing. No epileptiform activity was noted. Findings consistent with current sedation.  12/06: NMBs D/C'd 12/07 tolerates PS 10 - 14 12/08 Extubated  LINES/TUBES: R radial A line 12/04 >> 12/05 L anterior chest tube 12/04 >> 12/07 R anterior chest tube 12/04 >> 12/07 ETT 12/04 >> 12/08  R Kinder CVL 12/04 >>  L radial A-line 12/05 >>  L chest tube 12/04 >>  R chest tube 12/04 >>   MICRO:  MRSA PCR 12/04 >> NEG Urine 12/05 >> NEG Resp 12/05 >> NOF Blood 12/05 >>   ABX:  Ceftrx 12/04 X 1 dose  PROPHYLAXIS: DVT: SQ heparin SUP: H2RB > D/C'd after extubation  CONSULTANTS:  Neuro [name]) 12/04 >>    Filed Vitals:   02/19/12 0700 02/19/12 0740 02/19/12 0800 02/19/12 0900  BP: 146/86  130/65 118/65  Pulse: 42  73 71  Temp:   98.5 F (36.9 C)   TempSrc:   Oral   Resp: 15  18 15   Height:      Weight:      SpO2: 99% 96% 97% 98%    EXAM:  Gen: RASS -1, not F/C HEENT: NCAT Neck: No JVD Lungs: No wheezes no air leak on any chest  tubes Cardiovascular: RRR s M, 2+ DP pulses Abdomen: Obese, soft, NT, diminished BS Ext: No edema, warm Neuro: pupils equal and react, MAEs Skin: WNL  DATA: CBC    Component Value Date/Time   WBC 14.3* 02/17/2012 0549   RBC 4.33 02/17/2012 0549   HGB 12.0 02/17/2012 0549   HCT 38.6 02/17/2012 0549   PLT 279 02/17/2012 0549   MCV 89.1 02/17/2012 0549   MCH 27.7 02/17/2012 0549   MCHC 31.1 02/17/2012 0549   RDW 12.9 02/17/2012 0549   LYMPHSABS 7.4* 02/14/2012 0752   MONOABS 1.6* 02/14/2012 0752   EOSABS 0.9* 02/14/2012 0752   BASOSABS 0.2* 02/14/2012 0752    BMET    Component Value Date/Time   NA 138 02/19/2012 0417   K 3.7 02/19/2012 0417   CL 100 02/19/2012 0417   CO2 29 02/19/2012 0417   GLUCOSE 141* 02/19/2012 0417   BUN 23 02/19/2012 0417   CREATININE 0.64 02/19/2012 0417   CALCIUM 8.7 02/19/2012 0417   GFRNONAA >90 02/19/2012 0417   GFRAA >90 02/19/2012 0417    ABG    Component Value Date/Time   PHART 7.494* 02/17/2012 2110   PCO2ART 41.5 02/17/2012 2110   PO2ART 82.5 02/17/2012 2110   HCO3 31.7* 02/17/2012 2110   TCO2 27.7 02/17/2012 2110   ACIDBASEDEF 2.1* 02/14/2012 2321   O2SAT 97.6 02/17/2012 2110    CXR: mild edema pattern,  NSC, no ptx   IMPRESSION:   Principal Problem:  *Status asthmaticus - resolved Active Problems:  Pulmonary edema vs mild ALI  Acute respiratory failure  Acute encephalopathy, hypoxic and hypercarbic  Anisocoria, resolved 12/05  ? Hypercarbic cerebral edema  Tension pneumothorax, spontaneous  Hyperkalemia, resolved  Steroid-induced hyperglycemia  Panic attacks, history of  Looks good post extubation. Remains encephalopathic   PLAN:  Change to SDU. D/C propofol. Cont PRN fentanyl. D/C precedex. Taper systemic steroids solumedrol to 40 mg IV q12 hours. Change MDIs to nebulized. Cont Empiric SSI in setting of systemic steroids - begin diet. Cont chest tubes on water seal, if CXR is normal in AM will remove right then left. Hold  lasix. Monitor CXR for resolution of edema vs ALI. Mother and patient has been updated in detail bedside.  Alexandra Thornton, M.D. [facility] Pulmonary/Critical Care Medicine. Pager: [phone]. After hours pager: [phone].

## 2012-02-19 NOTE — Progress Notes (Signed)
21 year old female admitted on 02/14/2012 with status asthmaticus intubated for respiratory failure and extubated on 02/18/2012. Moderately sedated on precedex drip due to agitation on day shift. Follows commands, napping intermittently. Oxygen saturation maintaining in upper 90s on room air, 2 posterior lower chest tubes to water seal with minimal serosanguinous drainage noted. Sinus bradycardia on monitor (on precedex), BP 148/85. CBGs in 140 range (on steroids). Mother attentive at bedside, updates and moral support given.

## 2012-02-19 NOTE — Progress Notes (Signed)
Follow up for support with pt and mother at bedside.    Pt seated in chair, spoke with chaplain about joy around recovery.    Will continue to follow for support as needed.    Belva Crome  MDiv, Chaplain

## 2012-02-20 ENCOUNTER — Inpatient Hospital Stay (HOSPITAL_COMMUNITY): Payer: BC Managed Care – PPO

## 2012-02-20 LAB — CBC
HCT: 37.7 % (ref 36.0–46.0)
Hemoglobin: 13 g/dL (ref 12.0–15.0)
MCH: 28.1 pg (ref 26.0–34.0)
MCHC: 34.5 g/dL (ref 30.0–36.0)
MCV: 81.6 fL (ref 78.0–100.0)
RDW: 12.5 % (ref 11.5–15.5)

## 2012-02-20 LAB — BASIC METABOLIC PANEL
BUN: 24 mg/dL — ABNORMAL HIGH (ref 6–23)
Calcium: 8.5 mg/dL (ref 8.4–10.5)
Creatinine, Ser: 0.69 mg/dL (ref 0.50–1.10)
GFR calc Af Amer: >90 mL/min (ref >90)
GFR calc non Af Amer: >90 mL/min (ref >90)
Glucose, Bld: 106 mg/dL — ABNORMAL HIGH (ref 70–99)
Potassium: 3 mEq/L — ABNORMAL LOW (ref 3.5–5.1)

## 2012-02-20 LAB — GLUCOSE, CAPILLARY
Glucose-Capillary: 129 mg/dL — ABNORMAL HIGH (ref 70–99)
Glucose-Capillary: 115 mg/dL — ABNORMAL HIGH (ref 70–99)

## 2012-02-20 MED ORDER — MORPHINE SULFATE 2 MG/ML IJ SOLN
INTRAMUSCULAR | Status: AC
  Filled 2012-02-20: qty 1

## 2012-02-20 MED ORDER — MORPHINE SULFATE 2 MG/ML IJ SOLN
1.0000 mg | INTRAMUSCULAR | Status: DC | PRN
  Administered 2012-02-20 – 2012-02-22 (×5): 1 mg via INTRAVENOUS
  Filled 2012-02-20 (×4): qty 1

## 2012-02-20 MED ORDER — POTASSIUM CHLORIDE 20 MEQ/15ML (10%) PO LIQD
40.0000 meq | Freq: Once | ORAL | Status: AC
  Administered 2012-02-20: 40 meq via ORAL
  Filled 2012-02-20: qty 30

## 2012-02-20 MED ORDER — POTASSIUM CHLORIDE CRYS ER 20 MEQ PO TBCR
40.0000 meq | EXTENDED_RELEASE_TABLET | Freq: Once | ORAL | Status: AC
  Administered 2012-02-20: 20 meq via ORAL
  Filled 2012-02-20: qty 2

## 2012-02-20 MED ORDER — POTASSIUM CHLORIDE 20 MEQ/15ML (10%) PO LIQD
20.0000 meq | Freq: Every day | ORAL | Status: DC
  Administered 2012-02-20: 20 meq via ORAL
  Filled 2012-02-20 (×2): qty 15

## 2012-02-20 NOTE — Evaluation (Signed)
Physical Therapy Evaluation Patient Details Name: Alexandra Thornton MRN: [id number] DOB: 05-Jul-1990 Today's Date: 02/20/2012 Time: 4098-1191 PT Time Calculation (min): 30 min  PT Assessment / Plan / Recommendation Clinical Impression  Pt presents with status asthmaticus and hypercarbic cerebral edema.  She was initially intubated in ICU, now extubated on RA.  Tolerated OOB and ambulation in hallway very well with RW (mainly to ensure safety of chest tube).  Suspect pt will do well without AD.  Will assess on next visit.  Pt will benefit from skilled PT in acute venue to address deficits.  PT recommends possible HHPT for follow up at D/C.     PT Assessment  Patient needs continued PT services    Follow Up Recommendations  Home health PT    Does the patient have the potential to tolerate intense rehabilitation      Barriers to Discharge None      Equipment Recommendations   (TBD)    Recommendations for Other Services     Frequency Min 3X/week    Precautions / Restrictions Precautions Precautions: Fall Precaution Comments: Pt with right chest tube.  Restrictions Weight Bearing Restrictions: No   Pertinent Vitals/Pain Some pain in lateral chest wall from chest tube.       Mobility  Bed Mobility Bed Mobility: Supine to Sit;Sitting - Scoot to Edge of Bed Supine to Sit: 4: Min guard;HOB elevated;With rails Sitting - Scoot to Edge of Bed: 5: Supervision Details for Bed Mobility Assistance: min/guard for safety when getting to EOB.  Min cues for technique and safety.   Transfers Transfers: Sit to Stand;Stand to Sit Sit to Stand: 4: Min guard;With upper extremity assist;From elevated surface;From bed Stand to Sit: 4: Min guard;With upper extremity assist;With armrests;To chair/3-in-1 Details for Transfer Assistance: Performed x 2 in order to use 3in1 at bedside.  Min/gaurd for safety with cues for hand placement, technique and controlled descent.   Ambulation/Gait Ambulation/Gait Assistance: 1: +2 Total assist Ambulation/Gait: Patient Percentage: 80% Ambulation Distance (Feet): 300 Feet Assistive device: Rolling walker Ambulation/Gait Assistance Details: +2 for safety and management of lines.  Utilized RW to attach chest tube to ensure safety of it. Pt does very well with ambulation with min cues for RW use.  Will assess ambulation without AD on next visit.  Gait Pattern: Step-through pattern;Decreased stride length;Trunk flexed Gait velocity: decreased Stairs: No Wheelchair Mobility Wheelchair Mobility: No    Shoulder Instructions     Exercises     PT Diagnosis: Difficulty walking;Generalized weakness  PT Problem List: Decreased strength;Decreased activity tolerance;Decreased balance;Decreased mobility PT Treatment Interventions: DME instruction;Gait training;Stair training;Functional mobility training;Therapeutic activities;Therapeutic exercise;Balance training;Patient/family education   PT Goals Acute Rehab PT Goals PT Goal Formulation: With patient/family Time For Goal Achievement: 03/05/12 Potential to Achieve Goals: Good Pt will go Supine/Side to Sit: with modified independence PT Goal: Supine/Side to Sit - Progress: Goal set today Pt will go Sit to Supine/Side: with modified independence PT Goal: Sit to Supine/Side - Progress: Goal set today Pt will go Sit to Stand: with modified independence PT Goal: Sit to Stand - Progress: Goal set today Pt will Ambulate: 51 - 150 feet;with modified independence;with least restrictive assistive device PT Goal: Ambulate - Progress: Goal set today Pt will Go Up / Down Stairs: 3-5 stairs;with supervision;with rail(s);with least restrictive assistive device PT Goal: Up/Down Stairs - Progress: Goal set today Pt will Perform Home Exercise Program: with supervision, verbal cues required/provided PT Goal: Perform Home Exercise Program - Progress: Goal set today  Visit Information   Last PT Received On: 02/20/12 Assistance Needed: +1    Subjective Data  Subjective: It just feels so good to be up and moving.  Patient Stated Goal: to get back home.    Prior Functioning  Home Living Lives With: Family Available Help at Discharge: Family;Available 24 hours/day Type of Home: House Home Access: Stairs to enter [employer] of Steps: 3-4 Entrance Stairs-Rails: Right;Left Home Layout: One level Bathroom Shower/Tub: Engineer, manufacturing systems: Standard Home Adaptive Equipment: None Prior Function Level of Independence: Independent Able to Take Stairs?: Yes Driving: Yes Vocation: Holiday representative Communication: No difficulties    Cognition  Overall Cognitive Status: Appears within functional limits for tasks assessed/performed Arousal/Alertness: Awake/alert Orientation Level: Appears intact for tasks assessed Behavior During Session: [facility] for tasks performed    Extremity/Trunk Assessment Right Lower Extremity Assessment RLE ROM/Strength/Tone: WFL for tasks assessed RLE Sensation: WFL - Light Touch RLE Coordination: WFL - gross/fine motor Left Lower Extremity Assessment LLE ROM/Strength/Tone: WFL for tasks assessed LLE Sensation: WFL - Light Touch LLE Coordination: WFL - gross/fine motor   Balance Balance Balance Assessed: Yes Dynamic Standing Balance Dynamic Standing - Balance Support: During functional activity Dynamic Standing - Level of Assistance: 5: Stand by assistance;4: Min assist Dynamic Standing - Balance Activities: Forward lean/weight shifting;Lateral lean/weight shifting Dynamic Standing - Comments: Stand by to min assist when pt standing performing self care activity.   End of Session PT - End of Session Activity Tolerance: Patient tolerated treatment well Patient left: in chair;with call bell/phone within reach;with family/visitor present Nurse Communication: Mobility status  GP     Page, Meribeth Mattes 02/20/2012, 3:11  PM

## 2012-02-20 NOTE — Progress Notes (Signed)
PCCM NOTE  Date of admission: 12/04  Pt Profile: 79 F [name] presented to [facility] ED via EMS with severe respiratory distress and intubated for severe hypercarbia due to status asthmaticus  SIGNIFICANT EVENTS/STUDIES: 12/04 Very difficult to ventilate - Nimbex, propofol 12/04 Anisocoria noted 12/04 CT head: Question cerebral edema  12/04 hypertonic saline protocol initiated 12/04 Neuro Consult - 3% NaCl, MRI ordered (too unstable to travel), EEG ordered 12/04 Episode of severe hyperkalemia with widening complex >> responded to Ca2+, HCO3 12/04 increased bilateral PTX - bilateral anterior chest tubes placed with resolution 12/04 FOB - extensive mucus plugging 12/05 Much improved vent mechanics. Nimbex and propofol continued 12/05 No air leak on any of the chest tube chambers 12/05 Anisocoria resolved - hypertonic saline discontinued 12/05 HCO3 gtt discontinued 12/05 EEG: general background slowing. No epileptiform activity was noted. Findings consistent with current sedation.  12/06: NMBs D/C'd 12/07 tolerates PS 10 - 14 12/08 Extubated  LINES/TUBES: R radial A line 12/04 >> 12/05 L anterior chest tube 12/04 >> 12/07 R anterior chest tube 12/04 >> 12/07 ETT 12/04 >> 12/08  R  CVL 12/04 >>  L radial A-line 12/05 >>  L chest tube 12/04 >>  R chest tube 12/04 >>   MICRO:  MRSA PCR 12/04 >> NEG Urine 12/05 >> NEG Resp 12/05 >> NOF Blood 12/05 >>   ABX:  Ceftrx 12/04 X 1 dose  PROPHYLAXIS: DVT: SQ heparin SUP: H2RB > D/C'd after extubation  CONSULTANTS:  Neuro [name]) 12/04 >>    Filed Vitals:   02/20/12 0200 02/20/12 0400 02/20/12 0600 02/20/12 0813  BP: 120/62 119/60 118/75   Pulse: 77 63 66   Temp:  98.2 F (36.8 C)    TempSrc:  Oral    Resp: 14 14 12    Height:      Weight:      SpO2: 100% 98% 99% 98%    EXAM:  Gen: RASS -1, not F/C HEENT: NCAT Neck: No JVD Lungs: No wheezes no air leak on any chest  tubes Cardiovascular: RRR s M, 2+ DP pulses Abdomen: Obese, soft, NT, diminished BS Ext: No edema, warm Neuro: pupils equal and react, MAEs Skin: WNL  DATA: CBC    Component Value Date/Time   WBC 16.3* 02/20/2012 0352   RBC 4.62 02/20/2012 0352   HGB 13.0 02/20/2012 0352   HCT 37.7 02/20/2012 0352   PLT 279 02/20/2012 0352   MCV 81.6 02/20/2012 0352   MCH 28.1 02/20/2012 0352   MCHC 34.5 02/20/2012 0352   RDW 12.5 02/20/2012 0352   LYMPHSABS 7.4* 02/14/2012 0752   MONOABS 1.6* 02/14/2012 0752   EOSABS 0.9* 02/14/2012 0752   BASOSABS 0.2* 02/14/2012 0752   BMET    Component Value Date/Time   NA 138 02/20/2012 0352   K 3.0* 02/20/2012 0352   CL 101 02/20/2012 0352   CO2 26 02/20/2012 0352   GLUCOSE 106* 02/20/2012 0352   BUN 24* 02/20/2012 0352   CREATININE 0.69 02/20/2012 0352   CALCIUM 8.5 02/20/2012 0352   GFRNONAA >90 02/20/2012 0352   GFRAA >90 02/20/2012 0352   ABG    Component Value Date/Time   PHART 7.494* 02/17/2012 2110   PCO2ART 41.5 02/17/2012 2110   PO2ART 82.5 02/17/2012 2110   HCO3 31.7* 02/17/2012 2110   TCO2 27.7 02/17/2012 2110   ACIDBASEDEF 2.1* 02/14/2012 2321   O2SAT 97.6 02/17/2012 2110   CXR: mild edema pattern, NSC, no ptx  IMPRESSION:   Principal Problem:  *Status asthmaticus - resolved Active Problems:  Pulmonary edema vs mild ALI  Acute respiratory failure  Acute encephalopathy, hypoxic and hypercarbic  Anisocoria, resolved 12/05  ? Hypercarbic cerebral edema  Tension pneumothorax, spontaneous  Hyperkalemia, resolved  Steroid-induced hyperglycemia  Panic attacks, history of  Looks good post extubation.  Encephalopathy completely resolved, no leak on pleuravac    PLAN:  Hold to SDU. D/C left sided chest tube. Cont PRN fentanyl. D/C all sedation. Taper systemic steroids solumedrol to 40 mg IV q12 hours. Changed MDIs to nebulized. Cont Empiric SSI in setting of systemic steroids - begin diet. Remove left sided chest tube and  repeat CXR. D/C lasix. Monitor CXR for resolution of edema vs ALI. Mother and patient has been updated in detail bedside.  Alyson Reedy, M.D. [facility] Pulmonary/Critical Care Medicine. Pager: [phone]. After hours pager: [phone].

## 2012-02-20 NOTE — Progress Notes (Signed)
eLink Physician-Brief Progress Note Patient Name: Alexandra Thornton DOB: 11/11/1990 MRN: [id number]  Date of Service  02/20/2012   HPI/Events of Note   Lab 02/20/12 0352 02/19/12 0417 02/18/12 0650 02/17/12 0549 02/16/12 1626  NA 138 138 143 145 148*  K 3.0* 3.7 -- -- --  CL 101 100 103 107 109  CO2 26 29 30 31  32  GLUCOSE 106* 141* 162* 156* 157*  BUN 24* 23 27* 26* 20  CREATININE 0.69 0.64 0.69 0.76 0.72  CALCIUM 8.5 8.7 8.9 9.2 8.9  MG 2.2 -- -- -- --  PHOS 4.5 -- -- -- --      eICU Interventions  Replete with kcl elixir   Intervention Category Intermediate Interventions: Electrolyte abnormality - evaluation and management  Alexandra Thornton 02/20/2012, 6:47 AM

## 2012-02-20 NOTE — Progress Notes (Signed)
Extubated on 02/18/2012 in am, 2 posterior lower chest tubes to water seal with minimal serosanguinous drainage. Sat up in chair for 16 hours, transferred to bed. VSS, SR on monitor. Fentanyl 12.5-25 mcg iv prn for pain r/t chest tubes. Mother attentive at bedside. Asthma exacerbation prevention teaching provided by teachback method. Pt able to verbalized ways to prevent asthma exacerbation and identify environmental triggers.

## 2012-02-21 ENCOUNTER — Inpatient Hospital Stay (HOSPITAL_COMMUNITY): Payer: BC Managed Care – PPO

## 2012-02-21 LAB — BASIC METABOLIC PANEL
BUN: 21 mg/dL (ref 6–23)
Creatinine, Ser: 0.72 mg/dL (ref 0.50–1.10)
GFR calc Af Amer: >90 mL/min (ref >90)
GFR calc non Af Amer: >90 mL/min (ref >90)
Potassium: 3.6 mEq/L (ref 3.5–5.1)

## 2012-02-21 LAB — CBC
HCT: 39.1 % (ref 36.0–46.0)
MCHC: 33.8 g/dL (ref 30.0–36.0)
MCV: 83.7 fL (ref 78.0–100.0)
Platelets: 272 10*3/uL (ref 150–400)
RDW: 13 % (ref 11.5–15.5)

## 2012-02-21 LAB — GLUCOSE, CAPILLARY: Glucose-Capillary: 86 mg/dL (ref 70–99)

## 2012-02-21 LAB — CULTURE, BLOOD (ROUTINE X 2): Culture: NO GROWTH

## 2012-02-21 MED ORDER — PREDNISONE 20 MG PO TABS
40.0000 mg | ORAL_TABLET | Freq: Every day | ORAL | Status: DC
  Administered 2012-02-22 – 2012-02-23 (×2): 40 mg via ORAL
  Filled 2012-02-21 (×3): qty 2

## 2012-02-21 MED ORDER — INSULIN ASPART 100 UNIT/ML ~~LOC~~ SOLN: 0.0000 [IU] | Freq: Four times a day (QID) | SUBCUTANEOUS | Status: DC

## 2012-02-21 MED ORDER — POTASSIUM CHLORIDE CRYS ER 20 MEQ PO TBCR: 40.0000 meq | EXTENDED_RELEASE_TABLET | Freq: Once | ORAL | Status: DC

## 2012-02-21 MED ORDER — POTASSIUM CHLORIDE 20 MEQ/15ML (10%) PO LIQD
40.0000 meq | Freq: Once | ORAL | Status: AC
  Administered 2012-02-21: 40 meq
  Filled 2012-02-21: qty 30

## 2012-02-21 NOTE — Progress Notes (Signed)
PCCM NOTE  Date of admission: 12/04  Pt Profile: 3 F [name] presented to [facility] ED via EMS with severe respiratory distress and intubated for severe hypercarbia due to status asthmaticus  SIGNIFICANT EVENTS/STUDIES: 12/04 Very difficult to ventilate - Nimbex, propofol 12/04 Anisocoria noted 12/04 CT head: Question cerebral edema  12/04 hypertonic saline protocol initiated 12/04 Neuro Consult - 3% NaCl, MRI ordered (too unstable to travel), EEG ordered 12/04 Episode of severe hyperkalemia with widening complex >> responded to Ca2+, HCO3 12/04 increased bilateral PTX - bilateral anterior chest tubes placed with resolution 12/04 FOB - extensive mucus plugging 12/05 Much improved vent mechanics. Nimbex and propofol continued 12/05 No air leak on any of the chest tube chambers 12/05 Anisocoria resolved - hypertonic saline discontinued 12/05 HCO3 gtt discontinued 12/05 EEG: general background slowing. No epileptiform activity was noted. Findings consistent with current sedation.  12/06: NMBs D/C'd 12/07 tolerates PS 10 - 14 12/08 Extubated  LINES/TUBES: R radial A line 12/04 >> 12/05 L anterior chest tube 12/04 >> 12/07 R anterior chest tube 12/04 >> 12/07 ETT 12/04 >> 12/08  R Atkinson CVL 12/04 >>  L radial A-line 12/05 >>  L chest tube 12/04 >>  R chest tube 12/04 >>   MICRO:  MRSA PCR 12/04 >> NEG Urine 12/05 >> NEG Resp 12/05 >> NOF Blood 12/05 >>   ABX:  Ceftrx 12/04 X 1 dose  PROPHYLAXIS: DVT: SQ heparin SUP: H2RB > D/C'd after extubation  CONSULTANTS:  Neuro [name]) 12/04 >>    Filed Vitals:   02/21/12 0202 02/21/12 0400 02/21/12 0800 02/21/12 0835  BP:  127/66 136/55   Pulse: 54 64 56   Temp:  97.8 F (36.6 C)  97.7 F (36.5 C)  TempSrc:  Oral  Oral  Resp: 17 14 16    Height:      Weight:      SpO2: 100% 99% 100% 98%    EXAM:  Gen: RASS -1, not F/C HEENT: NCAT Neck: No JVD Lungs: No wheezes no air leak on any  chest tubes Cardiovascular: RRR s M, 2+ DP pulses Abdomen: Obese, soft, NT, diminished BS Ext: No edema, warm Neuro: pupils equal and react, MAEs Skin: WNL  DATA: CBC    Component Value Date/Time   WBC 17.0* 02/21/2012 0549   RBC 4.67 02/21/2012 0549   HGB 13.2 02/21/2012 0549   HCT 39.1 02/21/2012 0549   PLT 272 02/21/2012 0549   MCV 83.7 02/21/2012 0549   MCH 28.3 02/21/2012 0549   MCHC 33.8 02/21/2012 0549   RDW 13.0 02/21/2012 0549   LYMPHSABS 7.4* 02/14/2012 0752   MONOABS 1.6* 02/14/2012 0752   EOSABS 0.9* 02/14/2012 0752   BASOSABS 0.2* 02/14/2012 0752   BMET    Component Value Date/Time   NA 137 02/21/2012 0549   K 3.6 02/21/2012 0549   CL 101 02/21/2012 0549   CO2 25 02/21/2012 0549   GLUCOSE 94 02/21/2012 0549   BUN 21 02/21/2012 0549   CREATININE 0.72 02/21/2012 0549   CALCIUM 9.2 02/21/2012 0549   GFRNONAA >90 02/21/2012 0549   GFRAA >90 02/21/2012 0549   ABG    Component Value Date/Time   PHART 7.494* 02/17/2012 2110   PCO2ART 41.5 02/17/2012 2110   PO2ART 82.5 02/17/2012 2110   HCO3 31.7* 02/17/2012 2110   TCO2 27.7 02/17/2012 2110   ACIDBASEDEF 2.1* 02/14/2012 2321   O2SAT 97.6 02/17/2012 2110   CXR: mild edema pattern,  NSC, no ptx  IMPRESSION:   Principal Problem:  *Status asthmaticus - resolved Active Problems:  Pulmonary edema vs mild ALI  Acute respiratory failure  Acute encephalopathy, hypoxic and hypercarbic  Anisocoria, resolved 12/05  ? Hypercarbic cerebral edema  Tension pneumothorax, spontaneous  Hyperkalemia, resolved  Steroid-induced hyperglycemia  Panic attacks, history of  Looks good post extubation.  Encephalopathy completely resolved, no leak on pleuravac    PLAN:  Hold to SDU. D/C right sided chest tube. Cont PRN morphine. Change solumedrol to prednisone 40 mg PO daily with taper over the next two weeks. Changed MDIs to nebulized. Cont Empiric SSI in setting of systemic steroids - begin diet. Remove right sided chest  tube and repeat CXR. D/C lasix. Monitor CXR for resolution of edema vs ALI. Mother and patient has been updated in detail bedside. k-d Alyson Reedy, M.D. [facility] Pulmonary/Critical Care Medicine. Pager: [phone]. After hours pager: [phone].

## 2012-02-21 NOTE — Progress Notes (Signed)
IV team attempted peripheral IV with no success. Will keep central line for now.

## 2012-02-21 NOTE — Progress Notes (Signed)
Nutrition Follow-up  Intervention:   1.  General healthful diet; encourage intake of food and beverages.  Pt states her intake has been improving. No additional interventions at this time.  RD to continue to monitor improvement.   Assessment:   Pt admitted with status asthmaticus and intubated due to respiratory insufficiency.  Pt now extubated and had chest tubes removed today.  Her diet has been advanced and pt states her appetite and intake are returning to normal.  She reports today was her best day of intake due to being more comfortable after chest tube removal.    Diet Order:  Regular  Meds: Scheduled Meds:   . albuterol  2.5 mg Nebulization Q6H  . budesonide  0.5 mg Nebulization BID  . heparin  5,000 Units Subcutaneous Q8H  . insulin aspart  0-15 Units Subcutaneous Q6H  . ipratropium  0.5 mg Nebulization Q6H  . [EXPIRED] morphine      . [COMPLETED] potassium chloride  40 mEq Per Tube Once  . predniSONE  40 mg Oral Q breakfast  . [DISCONTINUED] insulin aspart  0-15 Units Subcutaneous Q6H  . [DISCONTINUED] methylPREDNISolone (SOLU-MEDROL) injection  40 mg Intravenous Q12H  . [DISCONTINUED] potassium chloride  20 mEq Oral Daily  . [DISCONTINUED] potassium chloride  40 mEq Oral Once   Continuous Infusions:  PRN Meds:.sodium chloride, albuterol, [COMPLETED] influenza  inactive virus vaccine, morphine injection, pneumococcal 23 valent vaccine   CMP     Component Value Date/Time   NA 137 02/21/2012 0549   K 3.6 02/21/2012 0549   CL 101 02/21/2012 0549   CO2 25 02/21/2012 0549   GLUCOSE 94 02/21/2012 0549   BUN 21 02/21/2012 0549   CREATININE 0.72 02/21/2012 0549   CALCIUM 9.2 02/21/2012 0549   GFRNONAA >90 02/21/2012 0549   GFRAA >90 02/21/2012 0549    CBG (last 3)   Basename 02/21/12 0801 02/21/12 0329 02/20/12 2235  GLUCAP 105* 122* 110*     Intake/Output Summary (Last 24 hours) at 02/21/12 1611 Last data filed at 02/21/12 1600  Gross per 24 hour  Intake    850  ml  Output     50 ml  Net    800 ml    Weight Status:  220 lbs Admission wt: 209 lbs  Re-estimated needs:  1850-2105 kcal, 65-78g protein  Nutrition Dx:  Inadequate oral intake, ongoing with improvement  Monitor:   1.  Enteral nutrition; initiation with tolerance.  No longer appropriate, pt on PO diet. 2.  Food/Beverage; pt continues with improving intake up to 75% of meals on average.   Loyce Dys, MS RD LDN Clinical Inpatient Dietitian Pager: [phone] Weekend/After hours pager: [phone]

## 2012-02-21 NOTE — Progress Notes (Signed)
Chaplain provided brief continued support with mother and pt.   Both joyous in pt's recovery and looking forward to discharge Friday.

## 2012-02-22 ENCOUNTER — Inpatient Hospital Stay (HOSPITAL_COMMUNITY): Payer: BC Managed Care – PPO

## 2012-02-22 DIAGNOSIS — F410 Panic disorder [episodic paroxysmal anxiety] without agoraphobia: Secondary | ICD-10-CM

## 2012-02-22 LAB — BASIC METABOLIC PANEL
Chloride: 104 mEq/L (ref 96–112)
GFR calc Af Amer: >90 mL/min (ref >90)
Potassium: 3.4 mEq/L — ABNORMAL LOW (ref 3.5–5.1)

## 2012-02-22 LAB — GLUCOSE, CAPILLARY
Glucose-Capillary: 98 mg/dL (ref 70–99)
Glucose-Capillary: 95 mg/dL (ref 70–99)
Glucose-Capillary: 85 mg/dL (ref 70–99)

## 2012-02-22 MED ORDER — SODIUM CHLORIDE 0.9 % IJ SOLN: 10.0000 mL | INTRAMUSCULAR | Status: DC | PRN

## 2012-02-22 MED ORDER — PAROXETINE HCL 10 MG PO TABS
5.0000 mg | ORAL_TABLET | Freq: Every day | ORAL | Status: DC
  Administered 2012-02-22 – 2012-02-23 (×2): 5 mg via ORAL
  Filled 2012-02-22 (×2): qty 0.5

## 2012-02-22 NOTE — Progress Notes (Signed)
Received patient from Jamelle Rushing, RN. OOB to chair, no s/s of distress. Per Darl Pikes IV team made aware that CVL to be removed be removed and no IV access due to patient to be going home in AM

## 2012-02-22 NOTE — Progress Notes (Signed)
PCCM NOTE  Date of admission: 12/04  Pt Profile: 40 F [name] presented to [facility] ED via EMS with severe respiratory distress and intubated for severe hypercarbia due to status asthmaticus  SIGNIFICANT EVENTS/STUDIES: 12/04 Very difficult to ventilate - Nimbex, propofol 12/04 Anisocoria noted 12/04 CT head: Question cerebral edema  12/04 hypertonic saline protocol initiated 12/04 Neuro Consult - 3% NaCl, MRI ordered (too unstable to travel), EEG ordered 12/04 Episode of severe hyperkalemia with widening complex >> responded to Ca2+, HCO3 12/04 increased bilateral PTX - bilateral anterior chest tubes placed with resolution 12/04 FOB - extensive mucus plugging 12/05 Much improved vent mechanics. Nimbex and propofol continued 12/05 No air leak on any of the chest tube chambers 12/05 Anisocoria resolved - hypertonic saline discontinued 12/05 HCO3 gtt discontinued 12/05 EEG: general background slowing. No epileptiform activity was noted. Findings consistent with current sedation.  12/06: NMBs D/C'd 12/07 tolerates PS 10 - 14 12/08 Extubated  LINES/TUBES: R radial A line 12/04 >> 12/05 L anterior chest tube 12/04 >> 12/07 R anterior chest tube 12/04 >> 12/07 ETT 12/04 >> 12/08  R [place] CVL 12/04 >>  L radial A-line 12/05 >> out L chest tube 12/04 >> 12-10 R chest tube 12/04 >> 12-11  MICRO:  MRSA PCR 12/04 >> NEG Urine 12/05 >> NEG Resp 12/05 >> NOF Blood 12/05 >> neg  ABX:  Ceftrx 12/04 X 1 dose  PROPHYLAXIS: DVT: SQ heparin SUP: H2RB > D/C'd after extubation  CONSULTANTS:  Neuro [name]) 12/04 >>    Filed Vitals:   02/21/12 2300 02/22/12 0242 02/22/12 0520 02/22/12 0902  BP: 113/63  118/73   Pulse: 77  65   Temp: 98 F (36.7 C)  97.3 F (36.3 C)   TempSrc: Oral  Oral   Resp: 20  18   Height:      Weight:      SpO2: 96% 97% 97% 96%    EXAM:  Gen: RASS -1, not F/C HEENT: NCAT Neck: No JVD Lungs: No wheezes no air leak  on any chest tubes Cardiovascular: RRR s M, 2+ DP pulses Abdomen: Obese, soft, NT, diminished BS Ext: No edema, warm Neuro: pupils equal and react, MAEs Skin: WNL  DATA: CBC    Component Value Date/Time   WBC 17.0* 02/21/2012 0549   RBC 4.67 02/21/2012 0549   HGB 13.2 02/21/2012 0549   HCT 39.1 02/21/2012 0549   PLT 272 02/21/2012 0549   MCV 83.7 02/21/2012 0549   MCH 28.3 02/21/2012 0549   MCHC 33.8 02/21/2012 0549   RDW 13.0 02/21/2012 0549   LYMPHSABS 7.4* 02/14/2012 0752   MONOABS 1.6* 02/14/2012 0752   EOSABS 0.9* 02/14/2012 0752   BASOSABS 0.2* 02/14/2012 0752   BMET    Component Value Date/Time   NA 139 02/22/2012 0500   K 3.4* 02/22/2012 0500   CL 104 02/22/2012 0500   CO2 25 02/22/2012 0500   GLUCOSE 93 02/22/2012 0500   BUN 23 02/22/2012 0500   CREATININE 0.77 02/22/2012 0500   CALCIUM 8.7 02/22/2012 0500   GFRNONAA >90 02/22/2012 0500   GFRAA >90 02/22/2012 0500   ABG    Component Value Date/Time   PHART 7.494* 02/17/2012 2110   PCO2ART 41.5 02/17/2012 2110   PO2ART 82.5 02/17/2012 2110   HCO3 31.7* 02/17/2012 2110   TCO2 27.7 02/17/2012 2110   ACIDBASEDEF 2.1* 02/14/2012 2321   O2SAT 97.6 02/17/2012 2110   CXR:  Dg Chest  Port 1 View  02/22/2012  *RADIOLOGY REPORT*  Clinical Data: Follow up pneumothorax  PORTABLE CHEST - 1 VIEW  Comparison: Multiple priors, most recent 02/21/2012  Findings: Central venous catheter tip remains at the cavoatrial junction.  There is persistent volume loss in the right hemithorax, but no visible pneumothorax.  Right chest tube has been removed. There is moderate subcutaneous air along the left chest wall. Unchanged heart size.  IMPRESSION: No visible right-sided pneumothorax.  Continued volume loss right hemithorax.   Original Report Authenticated By: Davonna Belling, M.D.    Dg Chest Port 1 View  02/21/2012  *RADIOLOGY REPORT*  Clinical Data: Follow up pneumothorax  PORTABLE CHEST - 1 VIEW  Comparison: 02/21/2012  Findings: Right  chest tube has been removed.  Negative for pneumothorax.  Gas is present in the subcutaneous tissues of the left chest wall. No pneumothorax on the left.  Elevated right hemidiaphragm with mild right lower lobe atelectasis, unchanged.  Right subclavian central venous catheter tip in the SVC, unchanged.  IMPRESSION: Negative for pneumothorax post right chest tube removal.   Original Report Authenticated By: Janeece Riggers, M.D.    Dg Chest Port 1 View  02/21/2012  *RADIOLOGY REPORT*  Clinical Data: Pneumothorax.  PORTABLE CHEST - 1 VIEW  Comparison: Chest x-ray 02/20/2012.  Findings: The right-sided chest tube in position with tip and sideport projecting over the right hemithorax.  Right-sided subclavian central venous catheter with tip terminating in the superior cavoatrial junction.  Persistent elevation of the right hemidiaphragm with probable right lower lobe subsegmental atelectasis.  No consolidative air space disease.  No pleural effusions.  There is some subcutaneous gas tracking along the left chest wall (unchanged).  Pulmonary vasculature is within normal limits.  Heart size is upper limits of normal.  Mediastinal contours are unremarkable.  IMPRESSION: 1.  Support apparatus as above. 2.  No right-sided pneumothorax. 3.  Persistent elevation right hemidiaphragm with right lower lobe subsegmental atelectasis. 4.  Subcutaneous emphysema along the left chest wall (unchanged).   Original Report Authenticated By: Trudie Reed, M.D.    Dg Chest Port 1 View  02/20/2012  *RADIOLOGY REPORT*  Clinical Data: Chest tube placement  PORTABLE CHEST - 1 VIEW  Comparison: Earlier same day; 02/19/2012; 02/18/2012  Findings:  Grossly unchanged cardiac silhouette and mediastinal contours. Interval removal of left-sided chest tube.  No definite pneumothorax.  The amount of left lateral chest wall subcutaneous emphysema is grossly unchanged.  Otherwise, stable positioning of support apparatus.  No definite right-sided  pneumothorax.  There is persistent mild elevation of the right hemidiaphragm.  Grossly unchanged right infrahilar heterogeneous opacities.  No new focal airspace opacity.  No definite pleural effusion.  Unchanged bones.  IMPRESSION: 1.  Interval removal of left-sided chest tube without definite pneumothorax. 2.  Otherwise, stable positioning of support apparatus.  3. Grossly unchanged right infrahilar heterogeneous opacities favored to represent atelectasis.   Original Report Authenticated By: Tacey Ruiz, MD     IMPRESSION:   Principal Problem:  *Status asthmaticus - resolved Active Problems:  Pulmonary edema vs mild ALI, resolved  Acute respiratory failure resolved   Acute encephalopathy, hypoxic and hypercarbic, resolved  Anisocoria, resolved 12/05  ? Hypercarbic cerebral edema. resolved  Tension pneumothorax, spontaneous,resolved  Hyperkalemia, resolved  Steroid-induced hyperglycemia, resolving  Panic attacks, history of, no change     PLAN:  On floor  right sided chest tube out 12-11 DC PRN morphine. Changed solumedrol to prednisone 40 mg PO daily with taper over the next  two weeks. Changed MDIs to nebulized. Cont Empiric SSI in setting of systemic steroids - begin diet. Mother and patient has been updated in detail bedside.Plan to dc 12-13 early Remove TLC. Paxil 5 mg daily ordered. Follow up with pul MD in [place] and [place].   Alexandra Thornton, M.D. [facility] Pulmonary/Critical Care Medicine. Pager: [phone]. After hours pager: [phone].

## 2012-02-22 NOTE — Progress Notes (Signed)
Physical Therapy Treatment Patient Details Name: Alexandra Thornton MRN: [id number] DOB: Oct 04, 1990 Today's Date: 02/22/2012 Time: 5784-6962 PT Time Calculation (min): 23 min  PT Assessment / Plan / Recommendation Comments on Treatment Session  Pt is mobilizing very well. All goals met during session. No LOB or difficulty tolerating activity. No further acute PT needs. PT will sign off. Encouraged pt to ambulate in halls ~ 2 laps, 2-3x/day, as tolerated.     Follow Up Recommendations  No PT follow up     Does the patient have the potential to tolerate intense rehabilitation     Barriers to Discharge        Equipment Recommendations  None recommended by PT    Recommendations for Other Services    Frequency     Plan All goals met and education completed, patient dischaged from PT services    Precautions / Restrictions Precautions Precautions: None Restrictions Weight Bearing Restrictions: No   Pertinent Vitals/Pain No c/o    Mobility  Bed Mobility Bed Mobility: Supine to Sit Supine to Sit: 6: Modified independent (Device/Increase time) Sitting - Scoot to Edge of Bed: 6: Modified independent (Device/Increase time) Transfers Transfers: Sit to Stand;Stand to Sit Sit to Stand: 6: Modified independent (Device/Increase time) Stand to Sit: 6: Modified independent (Device/Increase time) Ambulation/Gait Ambulation/Gait Assistance: 6: Modified independent (Device/Increase time) Ambulation Distance (Feet): 800 Feet Assistive device: None Ambulation/Gait Assistance Details: slower but steady gait speed. no lob. pt denied sob throughout ambulation.  Stairs: Yes Stairs Assistance: 6: Modified independent (Device/Increase time) Stair Management Technique: No rails Number of Stairs: 4     Exercises     PT Diagnosis:    PT Problem List:   PT Treatment Interventions:     PT Goals Acute Rehab PT Goals Pt will go Supine/Side to Sit: with modified independence PT Goal: Supine/Side  to Sit - Progress: Met Pt will go Sit to Stand: with modified independence PT Goal: Sit to Stand - Progress: Met Pt will Ambulate: 51 - 150 feet;with modified independence PT Goal: Ambulate - Progress: Met Pt will Go Up / Down Stairs: 3-5 stairs;with supervision;with least restrictive assistive device PT Goal: Up/Down Stairs - Progress: Met PT Goal: Perform Home Exercise Program - Progress: Discontinued (comment)  Visit Information  Last PT Received On: 02/22/12 Assistance Needed: +1    Subjective Data  Subjective: "i've had such good care here" Patient Stated Goal: back to school   Cognition  Overall Cognitive Status: Appears within functional limits for tasks assessed/performed Arousal/Alertness: Awake/Thornton Orientation Level: Appears intact for tasks assessed Behavior During Session: Alexandra Thornton for tasks performed    Balance  Dynamic Standing Balance Dynamic Standing - Balance Support: During functional activity;No upper extremity supported Dynamic Standing - Level of Assistance: 6: Modified independent (Device/Increase time) High Level Balance High Level Balance Activites: Direction changes;Turns;Sudden stops;Head turns High Level Balance Comments: modified independent with all activities.   End of Session PT - End of Session Activity Tolerance: Patient tolerated treatment well Patient left: in chair;with call bell/phone within reach;with family/visitor present   GP     Alexandra Thornton [facility] 02/22/2012, 10:16 AM [phone]

## 2012-02-23 ENCOUNTER — Inpatient Hospital Stay (HOSPITAL_COMMUNITY): Payer: BC Managed Care – PPO

## 2012-02-23 MED ORDER — IPRATROPIUM-ALBUTEROL 18-103 MCG/ACT IN AERO: 2.0000 | INHALATION_SPRAY | RESPIRATORY_TRACT | Status: DC

## 2012-02-23 MED ORDER — ALBUTEROL SULFATE HFA 108 (90 BASE) MCG/ACT IN AERS
2.0000 | INHALATION_SPRAY | RESPIRATORY_TRACT | Status: DC
  Filled 2012-02-23: qty 6.7

## 2012-02-23 MED ORDER — PREDNISONE 10 MG PO TABS: ORAL_TABLET | ORAL | Status: DC

## 2012-02-23 MED ORDER — ALBUTEROL SULFATE HFA 108 (90 BASE) MCG/ACT IN AERS: 2.0000 | INHALATION_SPRAY | RESPIRATORY_TRACT | Status: DC | PRN

## 2012-02-23 NOTE — Discharge Summary (Signed)
Physician Discharge Summary  Patient ID: Alexandra Thornton MRN: [id number] DOB/AGE: 31-Oct-1990 21 y.o.  Admit date: 02/14/2012 Discharge date: 02/23/2012  Problem List Principal Problem:  *Status asthmaticus Active Problems:  Acute respiratory failure with hypercapnia  Acute encephalopathy, hypoxic and hypercarbic  Panic attacks, history of  Anisocoria, resolved  Steroid-induced hyperglycemia  ? Hypercarbic cerebral edema  Tension pneumothorax, spontaneous  Pulmonary edema  HPI: 52 F [name] presented to [facility] ED via EMS with severe respiratory distress and intubated for severe hypercarbia due to status asthmaticus  Hospital Course:  SIGNIFICANT EVENTS/STUDIES:  12/04 Very difficult to ventilate - Nimbex, propofol  12/04 Anisocoria noted  12/04 CT head: Question cerebral edema  12/04 hypertonic saline protocol initiated  12/04 Neuro Consult - 3% NaCl, MRI ordered (too unstable to travel), EEG ordered  12/04 Episode of severe hyperkalemia with widening complex >> responded to Ca2+, HCO3  12/04 increased bilateral PTX - bilateral anterior chest tubes placed with resolution  12/04 FOB - extensive mucus plugging  12/05 Much improved vent mechanics. Nimbex and propofol continued  12/05 No air leak on any of the chest tube chambers  12/05 Anisocoria resolved - hypertonic saline discontinued  12/05 HCO3 gtt discontinued  12/05 EEG: general background slowing. No epileptiform activity was noted. Findings consistent with current sedation.  12/06: NMBs D/C'd  12/07 tolerates PS 10 - 14  12/08 Extubated  LINES/TUBES:  R radial A line 12/04 >> 12/05  L anterior chest tube 12/04 >> 12/07  R anterior chest tube 12/04 >> 12/07  ETT 12/04 >> 12/08  R [place] CVL 12/04 >>  L radial A-line 12/05 >> out  L chest tube 12/04 >> 12-10  R chest tube 12/04 >> 12-11  MICRO:  MRSA PCR 12/04 >> NEG  Urine 12/05 >> NEG  Resp 12/05 >> NOF  Blood 12/05 >> neg   ABX:  Ceftrx 12/04 X 1 dose  PROPHYLAXIS:  DVT: SQ heparin  SUP: H2RB > D/C'd after extubation  CONSULTANTS:  Neuro [name]) 12/04 >>   IMPRESSION:  Principal Problem:  *Status asthmaticus - resolved  Active Problems:  Pulmonary edema vs mild ALI, resolved  Acute respiratory failure resolved  Acute encephalopathy, hypoxic and hypercarbic, resolved  Anisocoria, resolved 12/05  ? Hypercarbic cerebral edema. resolved  Tension pneumothorax, spontaneous,resolved all chest tubes x 4 out. Hyperkalemia, resolved  Steroid-induced hyperglycemia, resolving  Panic attacks, history of, no change  PLAN:  On floor  right sided chest tube out 12-11  DC PRN morphine.  Changed solumedrol to prednisone 40 mg PO daily with taper over the next two weeks.  Changed MDIs to nebulized.  Cont Empiric SSI in setting of systemic steroids - begin diet.  Mother and patient has been updated in detail bedside.Plan to dc 12-13 early  Removed TLC.  Follow up with pul MD in [place] and [place].        Labs at discharge Lab Results  Component Value Date   CREATININE 0.77 02/22/2012   BUN 23 02/22/2012   NA 139 02/22/2012   K 3.4* 02/22/2012   CL 104 02/22/2012   CO2 25 02/22/2012   Lab Results  Component Value Date   WBC 17.0* 02/21/2012   HGB 13.2 02/21/2012   HCT 39.1 02/21/2012   MCV 83.7 02/21/2012   PLT 272 02/21/2012   No results found for this basename: ALT, AST, GGT, ALKPHOS, BILITOT   No results found for this basename: INR, PROTIME  Current radiology studies Dg Chest Port 1 View  02/23/2012  *RADIOLOGY REPORT*  Clinical Data: Pneumothorax  PORTABLE CHEST - 1 VIEW  Comparison: Yesterday  Findings: Normal heart size.  Right basilar atelectasis.  Left lung clear.  Subcutaneous emphysema over the left chest wall stable.  No pneumothorax. Right subclavian vein central venous catheter removed.  IMPRESSION: No pneumothorax.  Stable.   Original Report Authenticated By: Jolaine Click, M.D.    Dg Chest Port 1 View  02/22/2012  *RADIOLOGY REPORT*  Clinical Data: Follow up pneumothorax  PORTABLE CHEST - 1 VIEW  Comparison: Multiple priors, most recent 02/21/2012  Findings: Central venous catheter tip remains at the cavoatrial junction.  There is persistent volume loss in the right hemithorax, but no visible pneumothorax.  Right chest tube has been removed. There is moderate subcutaneous air along the left chest wall. Unchanged heart size.  IMPRESSION: No visible right-sided pneumothorax.  Continued volume loss right hemithorax.   Original Report Authenticated By: Davonna Belling, M.D.    Dg Chest Port 1 View  02/21/2012  *RADIOLOGY REPORT*  Clinical Data: Follow up pneumothorax  PORTABLE CHEST - 1 VIEW  Comparison: 02/21/2012  Findings: Right chest tube has been removed.  Negative for pneumothorax.  Gas is present in the subcutaneous tissues of the left chest wall. No pneumothorax on the left.  Elevated right hemidiaphragm with mild right lower lobe atelectasis, unchanged.  Right subclavian central venous catheter tip in the SVC, unchanged.  IMPRESSION: Negative for pneumothorax post right chest tube removal.   Original Report Authenticated By: Janeece Riggers, M.D.     Disposition:  Final discharge disposition not confirmed  Discharge Orders    Future Appointments: Provider: Department: Dept Phone: Center:   04/01/2012 3:15 PM Nyoka Cowden, MD [facility] [phone] None     Future Orders Please Complete By Expires   Discharge patient          Medication List     As of 02/23/2012 11:26 AM    STOP taking these medications         tiotropium 18 MCG inhalation capsule   Commonly known as: SPIRIVA      TAKE these medications         albuterol 108 (90 BASE) MCG/ACT inhaler   Commonly known as: PROVENTIL HFA;VENTOLIN HFA   Inhale 2 puffs into the lungs every 6 (six) hours as needed. Shortness of breath      albuterol-ipratropium 18-103 MCG/ACT inhaler    Commonly known as: COMBIVENT   Inhale 2 puffs into the lungs every 4 (four) hours as needed. Shortness of breath      Fluticasone-Salmeterol 500-50 MCG/DOSE Aepb   Commonly known as: ADVAIR   Inhale 1 puff into the lungs every 12 (twelve) hours.      montelukast 10 MG tablet   Commonly known as: SINGULAIR   Take 10 mg by mouth at bedtime.      predniSONE 10 MG tablet   Commonly known as: DELTASONE   Take 4 tabs  daily with food x 4 days, then 3 tabs daily x 4 days, then 2 tabs daily x 4 days, then 1 tab daily x4 days then stop. #40           Follow-up Information    Follow up with Sandrea Hughs, MD. On 04/01/2012. (Dr Sherene Sires 3:15 pm)    Contact information:   [address] [place] [address] [phone]  Please follow up. (Dr Roosevelt Locks, [place], [address] zip [phone])    Be at [facility] Dec 20 at 2 pm for chest xray and office appointment Dr.  Roosevelt Locks at 330 pm      Discharged Condition: Good Vital signs at Discharge. Temp:  [97.5 F (36.4 C)-98.2 F (36.8 C)] 97.5 F (36.4 C) (12/13 0542) Pulse Rate:  [68-88] 68  (12/13 0542) Resp:  [18-20] 18  (12/13 0542) BP: (104-122)/(41-52) 104/52 mmHg (12/13 0542) SpO2:  [97 %-100 %] 97 % (12/13 0946) FiO2 (%):  [21 %] 21 % (12/13 0946) Office follow up Special Information or instructions. Four chest tube sites, leave steri strips on for 5 days. Change dressing as needed. Signed: Brett Canales Minor ACNP [facility] PCCM Pager [phone] till 3 pm If no answer page [phone] 02/23/2012, 11:26 AM  Patient seen and examined, agree with above note. Will d/c home with f/u arranged as above.   Alyson Reedy, M.D. Pulmonary/Critical Care Medicine [phone].

## 2012-02-23 NOTE — Care Management Note (Signed)
    Page 1 of 2   02/23/2012     1:27:13 PM   CARE MANAGEMENT NOTE 02/23/2012  Patient:  Alexandra Thornton, Alexandra Thornton   Account Number:  [id number]  Date Initiated:  02/14/2012  Documentation initiated by:  DAVIS,RHONDA  Subjective/Objective Assessment:   pt admitted through er with status asthmatius and resp failure requiring intubation     Action/Plan:   college student   Anticipated DC Date:  02/23/2012   Anticipated DC Plan:  HOME/SELF CARE  In-house referral  NA      DC Planning Services  CM consult      PAC Choice  NA   Choice offered to / List presented to:  NA   DME arranged  NA      DME agency  NA     HH arranged  NA      HH agency  NA   Status of service:  Completed, signed off Medicare Important Message given?  NA - LOS <3 / Initial given by admissions (If response is "NO", the following Medicare IM given date fields will be blank) Date Medicare IM given:   Date Additional Medicare IM given:    Discharge Disposition:  HOME/SELF CARE  Per UR Regulation:  Reviewed for med. necessity/level of care/duration of stay  If discussed at Long Length of Stay Meetings, dates discussed:    Comments:  [id number]/Rhonda Earlene Plater, RN, BSN, CCM: CHART REVIEWED AND UPDATED. Next chart review due on [id number]. NO DISCHARGE NEEDS PRESENT AT THIS TIME. CASE MANAGEMENT [phone]  [id number] Earlene Plater, RN, BSN, CCM: CHART REVIEWED AND UPDATED. Next chart review due on [id number]. NO DISCHARGE NEEDS PRESENT AT THIS TIME. CASE MANAGEMENT [phone]

## 2012-02-29 ENCOUNTER — Inpatient Hospital Stay: Payer: BC Managed Care – PPO | Admitting: Internal Medicine

## 2012-03-19 ENCOUNTER — Telehealth: Payer: Self-pay | Admitting: Internal Medicine

## 2012-03-19 NOTE — Telephone Encounter (Signed)
Pt is scheduled for her HFU with MW on 04/01/12 and she will not return to [facility] until that time. She is requesting a statement or letter saying she is able to attend her spring semester classes at [employer]. MW, pls advise if you are willing to do this for the pt before seeing her for her HFU.

## 2012-03-20 ENCOUNTER — Encounter: Payer: Self-pay | Admitting: *Deleted

## 2012-03-20 NOTE — Telephone Encounter (Signed)
Letter was created and faxed to the number requested Pt aware and states nothing further needed

## 2012-03-20 NOTE — Telephone Encounter (Signed)
Ok to send letter

## 2012-04-01 ENCOUNTER — Encounter: Payer: Self-pay | Admitting: Internal Medicine

## 2012-04-01 ENCOUNTER — Ambulatory Visit (INDEPENDENT_AMBULATORY_CARE_PROVIDER_SITE_OTHER): Payer: BC Managed Care – PPO | Admitting: Internal Medicine

## 2012-04-01 ENCOUNTER — Ambulatory Visit (INDEPENDENT_AMBULATORY_CARE_PROVIDER_SITE_OTHER)
Admission: RE | Admit: 2012-04-01 | Discharge: 2012-04-01 | Disposition: A | Discharge status: Home / Self Care | Payer: BC Managed Care – PPO | Source: Ambulatory Visit | Attending: Internal Medicine | Admitting: Internal Medicine

## 2012-04-01 VITALS — BP 118/64 | HR 70 | Temp 97.2°F | Ht 64.0 in | Wt 218.0 lb

## 2012-04-01 DIAGNOSIS — J45909 Unspecified asthma, uncomplicated: Secondary | ICD-10-CM | POA: Insufficient documentation

## 2012-04-01 DIAGNOSIS — J930 Spontaneous tension pneumothorax: Secondary | ICD-10-CM

## 2012-04-01 MED ORDER — BUDESONIDE-FORMOTEROL FUMARATE 160-4.5 MCG/ACT IN AERO: INHALATION_SPRAY | RESPIRATORY_TRACT | Status: DC

## 2012-04-01 MED ORDER — ISOMETHEPTENE-APAP-DICHLORAL 65-325-100 MG PO CAPS: 1.0000 | ORAL_CAPSULE | Freq: Four times a day (QID) | ORAL | Status: DC | PRN

## 2012-04-01 NOTE — Progress Notes (Signed)
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 Hypercarbic cerebral edema  Tension pneumothorax, spontaneous  Pulmonary edema  HPI:  87 F [name] presented to [facility] ED via EMS with severe respiratory distress and intubated for severe hypercarbia due to status asthmaticus  Hospital Course:  SIGNIFICANT EVENTS/STUDIES:  12/04 Very difficult to ventilate - Nimbex, propofol  12/04 Anisocoria noted  12/04 CT head: Question cerebral edema  12/04 hypertonic saline protocol initiated  12/04 Neuro Consult - 3% NaCl, MRI ordered (too unstable to travel), EEG ordered  12/04 Episode of severe hyperkalemia with widening complex >> responded to Ca2+, HCO3  12/04 increased bilateral PTX - bilateral anterior chest tubes placed with resolution  12/04 FOB - extensive mucus plugging  12/05 Much improved vent mechanics. Nimbex and propofol continued  12/05 No air leak on any of the chest tube chambers  12/05 Anisocoria resolved - hypertonic saline discontinued  12/05 HCO3 gtt discontinued  12/05 EEG: general background slowing. No epileptiform activity was noted. Findings consistent with current sedation.  12/06: NMBs D/C'd  12/07 tolerates PS 10 - 14  12/08 Extubated  LINES/TUBES:  R radial A line 12/04 >> 12/05  L anterior chest tube 12/04 >> 12/07  R anterior chest tube  12/04 >> 12/07  ETT 12/04 >> 12/08  R [place] CVL 12/04 >>  L radial A-line 12/05 >> out  L chest tube 12/04 >> 12-10  R chest tube 12/04 >> 12-11  MICRO:  MRSA PCR 12/04 >> NEG  Urine 12/05 >> NEG  Resp 12/05 >> NOF  Blood 12/05 >> neg  ABX:  Ceftrx 12/04 X 1 dose  PROPHYLAXIS:  DVT: SQ heparin  SUP: H2RB > D/C'd after extubation  CONSULTANTS:  Neuro [name]) 12/04 >>   IMPRESSION:  Principal Problem:  *Status asthmaticus - resolved  Active Problems:  Pulmonary edema vs mild ALI, resolved  Acute respiratory failure resolved  Acute encephalopathy, hypoxic and hypercarbic, resolved  Anisocoria, resolved 12/05  ? Hypercarbic cerebral edema. resolved  Tension pneumothorax, spontaneous,resolved all chest tubes x 4 out.  Hyperkalemia, resolved  Steroid-induced hyperglycemia, resolving  Panic attacks, history of, no change  PLAN:  On floor  right sided chest tube out 12-11  DC PRN morphine.  Changed solumedrol to prednisone 40 mg PO daily with taper over the next two weeks.  Changed MDIs to nebulized.  Cont Empiric SSI in setting of systemic steroids - begin diet.  Mother and patient has been updated in detail bedside.Plan to dc 12-13 early  Removed TLC.  Follow up with pul MD in [place] and [place].    04/01/2012 f/u ov/Tahja Liao cc 1st pulmonary eval back to nl x still using albuterol before exercise and comes in today with ventolin on zero.  No obvious daytime variabilty or assoc chronic cough or cp or chest tightness, subjective wheeze overt   hb symptoms. No unusual exp hx .  Only new problem since discharge is freq band like pm ha with no assoc nasal symptoms or purulent nasal discharge  Sleeping ok without nocturnal  or early am exacerbation  of respiratory  c/o's or need for noct saba. Also denies any obvious fluctuation of symptoms with weather or environmental changes or other aggravating or alleviating factors except as outlined above  ROS  The following are not  active complaints unless bolded sore throat, dysphagia, dental problems, itching, sneezing,  nasal congestion or excess/ purulent secretions, ear ache,   fever, chills, sweats, unintended wt loss, pleuritic or exertional cp, hemoptysis,  orthopnea pnd or leg swelling, presyncope, palpitations, heartburn, abdominal pain, anorexia, nausea, vomiting, diarrhea  or change in bowel or urinary habits, change in stools or urine, dysuria,hematuria,  rash, arthralgias, visual complaints, headache, numbness weakness or ataxia or problems with walking or coordination,  change in mood/affect or memory.       Review of Systems     Objective:   Physical Exam Alt anxious amb wf nad Wt Readings from Last 3 Encounters:  04/01/12 218 lb (98.884 kg)  02/21/12 220 lb 14.4 oz (100.2 kg)    HEENT: nl dentition, turbinates, and orophanx. Nl external ear canals without cough reflex   NECK :  without JVD/Nodes/TM/ nl carotid upstrokes bilaterally   LUNGS: no acc muscle use, clear to A and P bilaterally without cough on insp or exp maneuvers   CV:  RRR  no s3 or murmur or increase in P2, no edema   ABD:  soft and nontender with nl excursion in the supine position. No bruits or organomegaly, bowel sounds nl  MS:  warm without deformities, calf tenderness, cyanosis or clubbing  SKIN: warm and dry without lesions    NEURO:  alert, approp, no deficits    CXR  04/01/2012 :  No active cardiopulmonary disease.     Assessment:          Plan:

## 2012-04-01 NOTE — Assessment & Plan Note (Signed)
DDX of  difficult airways managment all start with A and  include Adherence, Ace Inhibitors, Acid Reflux, Active Sinus Disease, Alpha 1 Antitripsin deficiency, Anxiety masquerading as Airways dz,  ABPA,  allergy(esp in young), Aspiration (esp in elderly), Adverse effects of DPI,  Active smokers, plus two Bs  = Bronchiectasis and Beta blocker use..and one C= CHF  In this case Adherence is the biggest issue and starts with  inability to use HFA effectively and also  understand that SABA treats the symptoms but doesn't get to the underlying problem (inflammation).  I used  the analogy of putting steroid cream on a rash to help explain the meaning of topical therapy and the need to get the drug to the target tissue.    The proper method of use, as well as anticipated side effects, of a metered-dose inhaler are discussed and demonstrated to the patient. Improved effectiveness after extensive coaching during this visit to a level of approximately  75% so change to dulera 200 2bid     Each maintenance medication was reviewed in detail including most importantly the difference between maintenance and as needed and under what circumstances the prns are to be used.  Please see instructions for details which were reviewed in writing and the patient given a copy.

## 2012-04-01 NOTE — Patient Instructions (Addendum)
Plan A  Symbicort 160 Take 2 puffs first thing in am and then another 2 puffs about 12 hours later and singulair also  Daily without fail  PLAN B  Is your action plan (only used in addition to Plan A) Only use your albuterol (xopenex or combivent but not both at the same time) as a rescue medication to be used if you can't catch your breath by resting or doing a relaxed purse lip breathing pattern. The less you use it, the better it will work when you need it. The goal is less than twice weekly.  For headaches try midrin every 4 hours  Please schedule a follow up office visit in 6 weeks, call sooner if needed with pfts on return (ok to see Tammy NP if I'm not available).

## 2012-04-01 NOTE — Assessment & Plan Note (Signed)
cxr resolved ptx

## 2012-04-17 ENCOUNTER — Telehealth: Payer: Self-pay | Admitting: Internal Medicine

## 2012-04-17 NOTE — Telephone Encounter (Signed)
Spoke with pt, she knows that we have sent a prescription for Symbicort but she can't use her flex spending account until March. She will need samples to last until then. I advised her of Primary Care downstairs on the first floor for a PCP.  Samples will be upfront for her to pick up.

## 2012-05-13 ENCOUNTER — Ambulatory Visit: Payer: BC Managed Care – PPO | Admitting: Internal Medicine

## 2012-06-19 ENCOUNTER — Ambulatory Visit: Payer: BC Managed Care – PPO | Admitting: Internal Medicine

## 2012-09-26 ENCOUNTER — Telehealth: Payer: Self-pay | Admitting: Internal Medicine

## 2012-09-26 NOTE — Telephone Encounter (Signed)
Last OV on 04-01-12. 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 Pt is needing a letter stating when she was hospitalized, how sick she was, and that it took time for her to recover in order to return back to school. Pt is having to do an academic appeal with [employer] because of her time missed for her illness. Pt will f/u once she returns to [employer]. Please advise if ok to do letter and what it should say. Carron Curie, CMA   Pt wants letter faxed and will call back with fax#.

## 2012-09-26 NOTE — Telephone Encounter (Signed)
Ok to write letter exactly what she said

## 2012-09-27 ENCOUNTER — Encounter: Payer: Self-pay | Admitting: *Deleted

## 2012-09-27 NOTE — Telephone Encounter (Signed)
Letter created Will print and have MW sign whenever she calls back with the fax number

## 2012-10-02 NOTE — Telephone Encounter (Signed)
Letter faxed to the number given and pt aware

## 2012-10-02 NOTE — Telephone Encounter (Signed)
Pt called back w/ fax # [phone]. Alexandra Thornton

## 2012-10-17 ENCOUNTER — Telehealth: Payer: Self-pay | Admitting: Internal Medicine

## 2012-10-17 NOTE — Telephone Encounter (Signed)
Ok to write the letter she wants but make sure she has f/u appt to regroup about the issue of restrictions on school and activities

## 2012-10-17 NOTE — Telephone Encounter (Signed)
I will do the letter, but I am not doing it until she calls back to make an appt

## 2012-10-17 NOTE — Telephone Encounter (Signed)
Called and spoke with pt and she stated that the letter that was written was too vague for the school.  She stated the letter needs to state that her asthma is doing ok since her hospital stay, that she have been sick, asthma has been in control.  She stated that this is for her appeal for her financial aid.  MW please advise if another letter can be done for the pt.  Thanks  Allergies  Allergen Reactions  . Peanut-Containing Drug Products     Per mother   . Azithromycin Other (See Comments)    Upset stomach

## 2012-10-18 ENCOUNTER — Encounter: Payer: Self-pay | Admitting: *Deleted

## 2012-10-18 NOTE — Telephone Encounter (Signed)
Patient has been scheduled to see MW Fri Aug 15 @ 930am Letter has been done and placed upfront for her pickup Nothing further needed at this time

## 2012-10-25 ENCOUNTER — Ambulatory Visit (INDEPENDENT_AMBULATORY_CARE_PROVIDER_SITE_OTHER): Payer: BC Managed Care – PPO | Admitting: Internal Medicine

## 2012-10-25 ENCOUNTER — Encounter: Payer: Self-pay | Admitting: Internal Medicine

## 2012-10-25 VITALS — BP 112/80 | HR 70 | Temp 97.0°F | Ht 63.0 in | Wt 233.4 lb

## 2012-10-25 DIAGNOSIS — J45909 Unspecified asthma, uncomplicated: Secondary | ICD-10-CM

## 2012-10-25 NOTE — Progress Notes (Signed)
Subjective:     Patient ID: Alexandra Thornton, female   DOB: 1991-01-20  MRN: [id number]  HPI  21 yowf never smoker [employer]  psychology/communications  major  asthma all her life previous allergy evaluation in [place] age 23 "everything" rec xolair but never started it maintained on advair and singulair but variably dependent on albuterol resulting in admit:   Admit date: 02/14/2012  Discharge date: 02/23/2012  Problem List  Principal Problem:  *Status asthmaticus  Active Problems:  Acute respiratory failure with hypercapnia  Acute encephalopathy, hypoxic and hypercarbic  Panic attacks, history of  Anisocoria, resolved  Steroid-induced hyperglycemia  ? Hypercarbic cerebral edema  Tension pneumothorax, spontaneous  Pulmonary edema  HPI:  12 F [name] presented to [facility] ED via EMS with severe respiratory distress and intubated for severe hypercarbia due to status asthmaticus  Hospital Course:  SIGNIFICANT EVENTS/STUDIES:  12/04 Very difficult to ventilate - Nimbex, propofol  12/04 Anisocoria noted  12/04 CT head: Question cerebral edema  12/04 hypertonic saline protocol initiated  12/04 Neuro Consult - 3% NaCl, MRI ordered (too unstable to travel), EEG ordered  12/04 Episode of severe hyperkalemia with widening complex >> responded to Ca2+, HCO3  12/04 increased bilateral PTX - bilateral anterior chest tubes placed with resolution  12/04 FOB - extensive mucus plugging  12/05 Much improved vent mechanics. Nimbex and propofol continued  12/05 No air leak on any of the chest tube chambers  12/05 Anisocoria resolved - hypertonic saline discontinued  12/05 HCO3 gtt discontinued  12/05 EEG: general background slowing. No epileptiform activity was noted. Findings consistent with current sedation.  12/06: NMBs D/C'd  12/07 tolerates PS 10 - 14  12/08 Extubated  LINES/TUBES:  R radial A line 12/04 >> 12/05  L anterior chest tube 12/04 >> 12/07   R anterior chest tube 12/04 >> 12/07  ETT 12/04 >> 12/08  R [place] CVL 12/04 >>  L radial A-line 12/05 >> out  L chest tube 12/04 >> 12-10  R chest tube 12/04 >> 12-11  MICRO:  MRSA PCR 12/04 >> NEG  Urine 12/05 >> NEG  Resp 12/05 >> NOF  Blood 12/05 >> neg  ABX:  Ceftrx 12/04 X 1 dose  PROPHYLAXIS:  DVT: SQ heparin  SUP: H2RB > D/C'd after extubation  CONSULTANTS:  Neuro Alexandra Thornton) 12/04 >>   IMPRESSION:  Principal Problem:  *Status asthmaticus - resolved  Active Problems:  Pulmonary edema vs mild ALI, resolved  Acute respiratory failure resolved  Acute encephalopathy, hypoxic and hypercarbic, resolved  Anisocoria, resolved 12/05  ? Hypercarbic cerebral edema. resolved  Tension pneumothorax, spontaneous,resolved all chest tubes x 4 out.  Hyperkalemia, resolved  Steroid-induced hyperglycemia, resolving  Panic attacks, history of, no change  PLAN:  On floor  right sided chest tube out 12-11  DC PRN morphine.  Changed solumedrol to prednisone 40 mg PO daily with taper over the next two weeks.  Changed MDIs to nebulized.  Cont Empiric SSI in setting of systemic steroids - begin diet.  Mother and patient has been updated in detail bedside.Plan to dc 12-13 early  Removed TLC.  Follow up with pul MD in [place] and [place].    04/01/2012 f/u ov/Alexandra Thornton cc 1st pulmonary eval back to nl x still using albuterol before exercise and comes in today with ventolin on zero. rec Plan A  Symbicort 160 Take 2 puffs first thing in am and then another 2 puffs about 12 hours later and  singulair also  Daily without fail PLAN B  Is your action plan (only used in addition to Plan A) Only use your albuterol (xopenex or combivent but not both at the same time) as a rescue medication to be used if you can't catch your breath by resting or doing a relaxed purse lip breathing pattern. The less you use it, the better it will work when you need it. The goal is less than twice weekly. For headaches try  midrin every 4 hours   10/25/2012 f/u ov/Alexandra Thornton re asthma with h/o status now maintained on symbicort 160 2bid singulair zyrtec Chief Complaint  Patient presents with  . Follow-up    Pt states doing well and denies any co's today.   no need at all rescue on ventolin (though note count is 120 and was zero last ov)  No obvious daytime variabilty or assoc chronic cough or cp or chest tightness, subjective wheeze overt hb symptoms. No unusual exp hx .    Sleeping ok without nocturnal  or early am exacerbation  of respiratory  c/o's or need for noct saba. Also denies any obvious fluctuation of symptoms with weather or environmental changes or other aggravating or alleviating factors except as outlined above  ROS  The following are not active complaints unless bolded sore throat, dysphagia, dental problems, itching, sneezing,  nasal congestion or excess/ purulent secretions, ear ache,   fever, chills, sweats, unintended wt loss, pleuritic or exertional cp, hemoptysis,  orthopnea pnd or leg swelling, presyncope, palpitations, heartburn, abdominal pain, anorexia, nausea, vomiting, diarrhea  or change in bowel or urinary habits, change in stools or urine, dysuria,hematuria,  rash, arthralgias, visual complaints, headache gone p school ? Computer screen ?  numbness weakness or ataxia or problems with walking or coordination,  change in mood/affect or memory.             Objective:   Physical Exam Alt anxious amb wf nad  Wt Readings from Last 3 Encounters:  10/25/12 233 lb 6.4 oz (105.87 kg)  04/01/12 218 lb (98.884 kg)  02/21/12 220 lb 14.4 oz (100.2 kg)        HEENT: nl dentition, turbinates, and orophanx. Nl external ear canals without cough reflex   NECK :  without JVD/Nodes/TM/ nl carotid upstrokes bilaterally   LUNGS: no acc muscle use, clear to A and P bilaterally without cough on insp or exp maneuvers   CV:  RRR  no s3 or murmur or increase in P2, no edema   ABD:  soft and  nontender with nl excursion in the supine position. No bruits or organomegaly, bowel sounds nl  MS:  warm without deformities, calf tenderness, cyanosis or clubbing  SKIN: warm and dry without lesions    NEURO:  alert, approp, no deficits    CXR  04/01/2012 :  No active cardiopulmonary disease.     Assessment:

## 2012-10-25 NOTE — Patient Instructions (Addendum)
If your breathing worsens or you need to use your rescue inhaler more than twice weekly or wake up more than twice a month with any respiratory symptoms or require more than two rescue inhalers per year, we need to see you right away because this means we're not controlling the underlying problem (inflammation) adequately.  Rescue inhalers do not control inflammation and overuse can lead to unnecessary and costly consequences.  They can make you feel better temporarily but eventually they will quit working effectively much as sleep aids lead to more insomnia if used regularly.   Please schedule a follow up visit in 3 months but call sooner if needed

## 2012-10-25 NOTE — Assessment & Plan Note (Signed)
The proper method of use, as well as anticipated side effects, of a metered-dose inhaler are discussed and demonstrated to the patient. Improved effectiveness after extensive coaching during this visit to a level of approximately  90%  Reviewed the greatest risk of status is h/o status to emphasize need to follow instructions carefully including contingencies  All goals of chronic asthma control met including optimal function and elimination of symptoms with minimal need for rescue therapy.  Contingencies discussed in full including contacting this office immediately if not controlling the symptoms using the rule of two's.     Follow up q 3 months

## 2012-12-26 ENCOUNTER — Telehealth: Payer: Self-pay | Admitting: Internal Medicine

## 2012-12-26 NOTE — Telephone Encounter (Signed)
I spoke with pt and she stated she went to [facility] bc she felt sick. She did not need anything further

## 2013-01-28 ENCOUNTER — Encounter: Payer: Self-pay | Admitting: Internal Medicine

## 2013-01-28 ENCOUNTER — Ambulatory Visit (INDEPENDENT_AMBULATORY_CARE_PROVIDER_SITE_OTHER): Payer: BC Managed Care – PPO | Admitting: Internal Medicine

## 2013-01-28 VITALS — BP 122/70 | HR 60 | Temp 97.1°F | Ht 63.5 in | Wt 233.6 lb

## 2013-01-28 DIAGNOSIS — J45909 Unspecified asthma, uncomplicated: Secondary | ICD-10-CM

## 2013-01-28 NOTE — Patient Instructions (Signed)
If any respiratory symptoms flare, try first adding pepcid ac 20 mg at bedtime  GERD (REFLUX)  is an extremely common cause of respiratory symptoms just like yours, many times with no significant heartburn at all.    It can be treated with medication, but also with lifestyle changes including avoidance of late meals, excessive alcohol, smoking cessation, and avoid fatty foods, chocolate, peppermint, colas, red wine, and acidic juices such as orange juice.  NO MINT OR MENTHOL PRODUCTS SO NO COUGH DROPS  USE SUGARLESS CANDY INSTEAD (jolley ranchers or Stover's)  NO OIL BASED VITAMINS - use powdered substitutes.  Return in early March 2015 - call sooner if needed

## 2013-01-28 NOTE — Progress Notes (Signed)
Subjective:     Patient ID: Alexandra Thornton, female   DOB: 07-02-1990  MRN: [id number]    Brief patient profile:  22 yowf never smoker [employer]  psychology/communications  major  asthma all her life previous allergy evaluation in [place] age 22 "everything" rec xolair but never started it maintained on advair and singulair but variably dependent on albuterol resulting in admit:   History of Present Illness   Admit date: 02/14/2012  Discharge date: 02/23/2012  Problem List  Principal Problem:  *Status asthmaticus  Active Problems:  Acute respiratory failure with hypercapnia  Acute encephalopathy, hypoxic and hypercarbic  Panic attacks, history of  Anisocoria, resolved  Steroid-induced hyperglycemia  ? Hypercarbic cerebral edema  Tension pneumothorax, spontaneous  Pulmonary edema  HPI:  [age] F [name] [facility] with severe respiratory distress and intubated for severe hypercarbia due to status asthmaticus  Hospital Course:  SIGNIFICANT EVENTS/STUDIES:  12/04 Very difficult to ventilate - Nimbex, propofol  12/04 Anisocoria noted  12/04 CT head: Question cerebral edema  12/04 hypertonic saline protocol initiated  12/04 Neuro Consult - 3% NaCl, MRI ordered (too unstable to travel), EEG ordered  12/04 Episode of severe hyperkalemia with widening complex >> responded to Ca2+, HCO3  12/04 increased bilateral PTX - bilateral anterior chest tubes placed with resolution  12/04 FOB - extensive mucus plugging  12/05 Much improved vent mechanics. Nimbex and propofol continued  12/05 No air leak on any of the chest tube chambers  12/05 Anisocoria resolved - hypertonic saline discontinued  12/05 HCO3 gtt discontinued  12/05 EEG: general background slowing. No epileptiform activity was noted. Findings consistent with current sedation.  12/06: NMBs D/C'd  12/07 tolerates PS 10 - 14  12/08 Extubated  LINES/TUBES:  R radial A line  12/04 >> 12/05  L anterior chest tube 12/04 >> 12/07  R anterior chest tube 12/04 >> 12/07  ETT 12/04 >> 12/08  R [place]ran CVL 12/04 >>  L radial A-line 12/05 >> out  L chest tube 12/04 >> 12-10  R chest tube 12/04 >> 12-11  MICRO:  MRSA PCR 12/04 >> NEG  Urine 12/05 >> NEG  Resp 12/05 >> NOF  Blood 12/05 >> neg  ABX:  Ceftrx 12/04 X 1 dose  PROPHYLAXIS:  DVT: SQ heparin  SUP: H2RB > D/C'd after extubation  CONSULTANTS:  Neuro [name]([place]) 12/04 >>   IMPRESSION:  Principal Problem:  *Status asthmaticus - resolved  Active Problems:  Pulmonary edema vs mild ALI, resolved  Acute respiratory failure resolved  Acute encephalopathy, hypoxic and hypercarbic, resolved  Anisocoria, resolved 12/05  ? Hypercarbic cerebral edema. resolved  Tension pneumothorax, spontaneous,resolved all chest tubes x 4 out.  Hyperkalemia, resolved  Steroid-induced hyperglycemia, resolving  Panic attacks, history of, no change  PLAN:  On floor  right sided chest tube out 12-11  DC PRN morphine.  Changed solumedrol to prednisone 40 mg PO daily with taper over the next two weeks.  Changed MDIs to nebulized.  Cont Empiric SSI in setting of systemic steroids - begin diet.  Mother and patient has been updated in detail bedside.Plan to dc 12-13 early  Removed TLC.  [place]    04/01/2012 f/u ov/Alexandra Thornton cc 1st pulmonary eval back to nl x still using albuterol before exercise and comes in today with ventolin on zero. rec Plan A  Symbicort 160 Take 2 puffs first thing in am  and then another 2 puffs about 12 hours later and singulair also  Daily without fail PLAN B  Is your action plan (only used in addition to Plan A) Only use your albuterol (xopenex or combivent but not both at the same time) as a rescue medication to be used if you can't catch your breath by resting or doing a relaxed purse lip breathing pattern. The less you use it, the better it will work when you need it.  The goal is less than twice weekly. For headaches try midrin every 4 hours   10/25/2012 f/u ov/Alexandra Thornton re asthma with h/o status now maintained on symbicort 160 2bid singulair zyrtec Chief Complaint  Patient presents with  . Follow-up    Pt states doing well and denies any co's today.   no need at all rescue on ventolin (though note count is 120 and was zero last ov) rec No change rx   01/28/2013 f/u ov/Alexandra Thornton re: asthma Chief Complaint  Patient presents with  . Follow-up    Pt states her breathing is doing well. No need for rescue inhaler in the past wk.     Min nasal congestion/ sore throat in am s purulent secretions. Not limited from desired activity by sob  No obvious daytime variabilty or assoc chronic cough or cp or chest tightness, subjective wheeze overt hb symptoms. No unusual exp hx .    Sleeping ok without nocturnal  or early am exacerbation  of respiratory  c/o's or need for noct saba. Also denies any obvious fluctuation of symptoms with weather or environmental changes or other aggravating or alleviating factors except as outlined above  ROS  The following are not active complaints unless bolded sore throat, dysphagia, dental problems, itching, sneezing,  nasal congestion or excess/ purulent secretions, ear ache,   fever, chills, sweats, unintended wt loss, pleuritic or exertional cp, hemoptysis,  orthopnea pnd or leg swelling, presyncope, palpitations, heartburn, abdominal pain, anorexia, nausea, vomiting, diarrhea  or change in bowel or urinary habits, change in stools or urine, dysuria,hematuria,  rash, arthralgias, visual complaints, headache    numbness weakness or ataxia or problems with walking or coordination,  change in mood/affect or memory.             Objective:   Physical Exam  Pleasant amb wf nad, all smiles now  01/28/2013     234  Wt Readings from Last 3 Encounters:  10/25/12 233 lb 6.4 oz (105.87 kg)  04/01/12 218 lb (98.884 kg)  02/21/12 220 lb 14.4  oz (100.2 kg)        HEENT: nl dentition, turbinates, and orophanx. Nl external ear canals without cough reflex   NECK :  without JVD/Nodes/TM/ nl carotid upstrokes bilaterally   LUNGS: no acc muscle use, clear to A and P bilaterally without cough on insp or exp maneuvers   CV:  RRR  no s3 or murmur or increase in P2, no edema   ABD:  soft and nontender with nl excursion in the supine position. No bruits or organomegaly, bowel sounds nl  MS:  warm without deformities, calf tenderness, cyanosis or clubbing       CXR  04/01/2012 :  No active cardiopulmonary disease.     Assessment:

## 2013-01-28 NOTE — Assessment & Plan Note (Signed)
All goals of chronic asthma control met including optimal function and elimination of symptoms with minimal need for rescue therapy.  Contingencies discussed in full including contacting this office immediately if not controlling the symptoms using the rule of two's.     Reminded re: use of pepcid ac 20 mg at hs plus diet in event of flares    Each maintenance medication was reviewed in detail including most importantly the difference between maintenance and as needed and under what circumstances the prns are to be used.  Please see instructions for details which were reviewed in writing and the patient given a copy.

## 2013-02-18 ENCOUNTER — Telehealth: Payer: Self-pay | Admitting: Internal Medicine

## 2013-02-18 MED ORDER — OSELTAMIVIR PHOSPHATE 75 MG PO CAPS: 75.0000 mg | ORAL_CAPSULE | Freq: Two times a day (BID) | ORAL | Status: DC

## 2013-02-18 NOTE — Telephone Encounter (Signed)
Yes, just give the standard dose

## 2013-02-18 NOTE — Telephone Encounter (Signed)
Should be fine if she's taken  the flu shot but if not could probably go ahead and take tamiflu rx now

## 2013-02-18 NOTE — Telephone Encounter (Signed)
rx has been sent. Nothing further needed 

## 2013-02-18 NOTE — Telephone Encounter (Signed)
Spoke with pt. She does not have a tamiflu RX. Her sister was giving rx. Do you want to prescribe pt one? thanks

## 2013-02-18 NOTE — Telephone Encounter (Signed)
Called, spoke with pt.  Pt reports she took her little sister to dr today.  Sister has cough, high fever, congestion x 2 days and was dx'd with Flu.  Per pt, her sister was given rx for tamiflu, asked to take tylenol, and use claritin.  Pt reports she's had close contact with her sister.  Pt did receive flu shot for this year.  She is not currently having any symptoms but is concerned because this is a bad time of the year for her asthma.  Reports she was hospitalized this time last year with her asthma.  She would like to know if MW has any recs for precautionary measures given she has already been exposed.  She is aware to practice good hand hygiene.  Dr. Sherene Sires, pls advise.  Thank you.  [facility] in [place], [place]  Allergies  Allergen Reactions  . Peanut-Containing Drug Products     Per mother   . Azithromycin Other (See Comments)    Upset stomach

## 2013-04-15 ENCOUNTER — Telehealth: Payer: Self-pay | Admitting: Internal Medicine

## 2013-04-15 MED ORDER — ALBUTEROL SULFATE HFA 108 (90 BASE) MCG/ACT IN AERS: 2.0000 | INHALATION_SPRAY | Freq: Four times a day (QID) | RESPIRATORY_TRACT | Status: DC | PRN

## 2013-04-15 NOTE — Telephone Encounter (Signed)
Pt aware samples of symbicort 160 have been left at front desk for pick up Nothing further is needed

## 2013-04-15 NOTE — Telephone Encounter (Signed)
Rx has been sent in. Pt is aware. 

## 2013-04-23 ENCOUNTER — Telehealth: Payer: Self-pay

## 2013-04-23 NOTE — Telephone Encounter (Signed)
Pt had pharmacy fax over a request stating that she does not want proventil hfa, but wants ventolin hfa.  ACT pt to tell her that these are the same medications, voicemail was not set up.  WCB.  Will forward to [place] also.

## 2013-06-18 ENCOUNTER — Other Ambulatory Visit: Payer: Self-pay | Admitting: Internal Medicine

## 2013-10-09 ENCOUNTER — Telehealth: Payer: Self-pay | Admitting: Internal Medicine

## 2013-10-09 NOTE — Telephone Encounter (Signed)
Called spoke with pt. She reports she is suppose to go to a hemalayan salt therapy and have deep tissue massage done. She wants to know if this is okay since she has asthma. Please advise MW thanks

## 2013-10-09 NOTE — Telephone Encounter (Signed)
Fine with me

## 2013-10-09 NOTE — Telephone Encounter (Signed)
Spoke with the pt and notified that this is okay per MW  She verbalized understanding  Nothing further needed

## 2013-11-07 ENCOUNTER — Ambulatory Visit (INDEPENDENT_AMBULATORY_CARE_PROVIDER_SITE_OTHER): Payer: BC Managed Care – PPO | Admitting: Internal Medicine

## 2013-11-07 ENCOUNTER — Encounter: Payer: Self-pay | Admitting: Internal Medicine

## 2013-11-07 ENCOUNTER — Encounter (INDEPENDENT_AMBULATORY_CARE_PROVIDER_SITE_OTHER): Payer: Self-pay

## 2013-11-07 VITALS — BP 102/60 | HR 64 | Temp 98.4°F | Ht 64.0 in | Wt 226.0 lb

## 2013-11-07 DIAGNOSIS — J00 Acute nasopharyngitis [common cold]: Secondary | ICD-10-CM | POA: Insufficient documentation

## 2013-11-07 DIAGNOSIS — J4530 Mild persistent asthma, uncomplicated: Secondary | ICD-10-CM

## 2013-11-07 DIAGNOSIS — J45909 Unspecified asthma, uncomplicated: Secondary | ICD-10-CM

## 2013-11-07 MED ORDER — PREDNISONE 10 MG PO TABS: ORAL_TABLET | ORAL | Status: DC

## 2013-11-07 NOTE — Assessment & Plan Note (Signed)
No evidence of infection > favor allergic or non-specific   rec  Prednisone 10 mg take  4 each am x 2 days,   2 each am x 2 days,  1 each am x 2 days and stop         Add pseudofed        Suggested nasal steroids but she is reluctant at this point

## 2013-11-07 NOTE — Assessment & Plan Note (Signed)
All goals of chronic asthma control met including optimal function and elimination of symptoms with minimal need for rescue therapy.  Contingencies discussed in full including contacting this office immediately if not controlling the symptoms using the rule of two's.    

## 2013-11-07 NOTE — Progress Notes (Signed)
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 Hypercarbic cerebral edema  Tension pneumothorax, spontaneous  Pulmonary edema  HPI:  65 F [name] presented to [facility] ED via EMS with severe respiratory distress and intubated for severe hypercarbia due to status asthmaticus  Hospital Course:  SIGNIFICANT EVENTS/STUDIES:  12/04 Very difficult to ventilate - Nimbex, propofol  12/04 Anisocoria noted  12/04 CT head: Question cerebral edema  12/04 hypertonic saline protocol initiated  12/04 Neuro Consult - 3% NaCl, MRI ordered (too unstable to travel), EEG ordered  12/04 Episode of severe hyperkalemia with widening complex >> responded to Ca2+, HCO3  12/04 increased bilateral PTX - bilateral anterior chest tubes placed with resolution  12/04 FOB - extensive mucus plugging  12/05 Much improved vent mechanics. Nimbex and propofol continued  12/05 No air leak on any of the chest tube chambers  12/05 Anisocoria resolved - hypertonic saline discontinued  12/05 HCO3 gtt discontinued  12/05 EEG: general background slowing. No epileptiform activity was noted. Findings consistent with current sedation.  12/06: NMBs D/C'd  12/07 tolerates PS 10 - 14  12/08 Extubated  LINES/TUBES:  R radial A line  12/04 >> 12/05  L anterior chest tube 12/04 >> 12/07  R anterior chest tube 12/04 >> 12/07  ETT 12/04 >> 12/08  R Parker CVL 12/04 >>  L radial A-line 12/05 >> out  L chest tube 12/04 >> 12-10  R chest tube 12/04 >> 12-11  MICRO:  MRSA PCR 12/04 >> NEG  Urine 12/05 >> NEG  Resp 12/05 >> NOF  Blood 12/05 >> neg  ABX:  Ceftrx 12/04 X 1 dose  PROPHYLAXIS:  DVT: SQ heparin  SUP: H2RB > D/C'd after extubation  CONSULTANTS:  Neuro Alexandra Thornton) 12/04 >>   IMPRESSION:  Principal Problem:  *Status asthmaticus - resolved  Active Problems:  Pulmonary edema vs mild ALI, resolved  Acute respiratory failure resolved  Acute encephalopathy, hypoxic and hypercarbic, resolved  Anisocoria, resolved 12/05  ? Hypercarbic cerebral edema. resolved  Tension pneumothorax, spontaneous,resolved all chest tubes x 4 out.  Hyperkalemia, resolved  Steroid-induced hyperglycemia, resolving  Panic attacks, history of, no change  PLAN:  On floor  right sided chest tube out 12-11  DC PRN morphine.  Changed solumedrol to prednisone 40 mg PO daily with taper over the next two weeks.  Changed MDIs to nebulized.  Cont Empiric SSI in setting of systemic steroids - begin diet.  Mother and patient has been updated in detail bedside.Plan to dc 12-13 early  Removed TLC.  Follow up with pul MD in [place] and [place].    04/01/2012 f/u ov/Alexandra Thornton cc 1st pulmonary eval back to nl x still using albuterol before exercise and comes in today with ventolin on zero. rec Plan A  Symbicort 160 Take 2 puffs first thing in am  and then another 2 puffs about 12 hours later and singulair also  Daily without fail PLAN B  Is your action plan (only used in addition to Plan A) Only use your albuterol (xopenex or combivent but not both at the same time) as a rescue medication to be used if you can't catch your breath by resting or doing a relaxed purse lip breathing pattern. The less you use it, the better it will work when you need it.  The goal is less than twice weekly. For headaches try midrin every 4 hours   10/25/2012 f/u ov/Alexandra Thornton re asthma with h/o status now maintained on symbicort 160 2bid singulair zyrtec Chief Complaint  Patient presents with  . Follow-up    Pt states doing well and denies any co's today.   no need at all rescue on ventolin (though note count is 120 and was zero last ov) rec No change rx   01/28/2013 f/u ov/Alexandra Thornton re: asthma Chief Complaint  Patient presents with  . Follow-up    Pt states her breathing is doing well. No need for rescue inhaler in the past wk.    Min nasal congestion/ sore throat in am s purulent secretions. Not limited from desired activity by sob rec If any respiratory symptoms flare, try first adding pepcid ac 20 mg at bedtime GERD (REFLUX)   11/07/2013 f/u ov/Alexandra Thornton re: chronic asthma on symbicort 2 bid / singulair/zyrtec Chief Complaint  Patient presents with  . Follow-up    Pt c/o sinus congestion and pressure for the past 2 days. She also c/o prod cough with moderate clear sputum.  Not limited by breathing from desired activities  / no need for any rescue saba   No obvious daytime variabilty or assoc chronic cough or cp or chest tightness, subjective wheeze overt hb symptoms. No unusual exp hx .    Sleeping ok without nocturnal  or early am exacerbation  of respiratory  c/o's or need for noct saba. Also denies any obvious fluctuation of symptoms with weather or environmental changes or other aggravating or alleviating factors except as outlined above  ROS  The following are not active complaints unless bolded sore throat, dysphagia, dental problems, itching, sneezing,  nasal congestion or excess/ purulent secretions, ear ache,   fever, chills, sweats, unintended wt loss, pleuritic or exertional cp, hemoptysis,  orthopnea pnd or leg swelling, presyncope, palpitations, heartburn, abdominal pain, anorexia, nausea, vomiting, diarrhea  or change in bowel or urinary habits,  change in stools or urine, dysuria,hematuria,  rash, arthralgias, visual complaints, headache    numbness weakness or ataxia or problems with walking or coordination,  change in mood/affect or memory.             Objective:   Physical Exam  Pleasant amb wf nad, all smiles    01/28/2013     234 > 11/07/2013  226  Wt Readings from Last 3 Encounters:  10/25/12 233 lb 6.4 oz (105.87 kg)  04/01/12 218 lb (98.884 kg)  02/21/12 220 lb 14.4 oz (100.2 kg)        HEENT: nl dentition,   and orophanx which is pristine/ mod non-speicific turbinate edema with clear secretions. Nl external ear canals without cough reflex   NECK :  without JVD/Nodes/TM/ nl carotid upstrokes bilaterally   LUNGS: no acc muscle use, clear to A and P bilaterally without cough on insp or exp maneuvers   CV:  RRR  no s3 or murmur or increase in P2, no edema  ABD:  soft and nontender with nl excursion in the supine position. No bruits or organomegaly, bowel sounds nl  MS:  warm without deformities, calf tenderness, cyanosis or clubbing       CXR  04/01/2012 :  No active cardiopulmonary disease.     Assessment:

## 2013-11-07 NOTE — Patient Instructions (Signed)
Prednisone 10 mg take  4 each am x 2 days,   2 each am x 2 days,  1 each am x 2 days and stop   When nose is stuffy should use either zyrtec d or clariton d or allegra d   If any respiratory symptoms flare, try first adding pepcid ac 20 mg at bedtime  GERD (REFLUX)  is an extremely common cause of respiratory symptoms just like yours, many times with no significant heartburn at all.    It can be treated with medication, but also with lifestyle changes including avoidance of late meals, excessive alcohol, smoking cessation, and avoid fatty foods, chocolate, peppermint, colas, red wine, and acidic juices such as orange juice.  NO MINT OR MENTHOL PRODUCTS SO NO COUGH DROPS  USE SUGARLESS CANDY INSTEAD (jolley ranchers or Stover's)  NO OIL BASED VITAMINS - use powdered substitutes.  Please schedule a follow up visit in 3 months but call sooner if needed

## 2014-01-29 ENCOUNTER — Ambulatory Visit: Payer: BC Managed Care – PPO | Admitting: Internal Medicine

## 2014-02-17 ENCOUNTER — Other Ambulatory Visit: Payer: Self-pay | Admitting: Internal Medicine

## 2014-02-19 ENCOUNTER — Other Ambulatory Visit: Payer: Self-pay | Admitting: *Deleted

## 2014-02-19 MED ORDER — ALBUTEROL SULFATE HFA 108 (90 BASE) MCG/ACT IN AERS: 2.0000 | INHALATION_SPRAY | Freq: Four times a day (QID) | RESPIRATORY_TRACT | Status: DC | PRN

## 2014-04-13 ENCOUNTER — Telehealth: Payer: Self-pay | Admitting: Internal Medicine

## 2014-04-13 MED ORDER — CETIRIZINE HCL 10 MG PO TABS: 10.0000 mg | ORAL_TABLET | Freq: Every day | ORAL | Status: AC

## 2014-04-13 MED ORDER — ALBUTEROL SULFATE HFA 108 (90 BASE) MCG/ACT IN AERS: 2.0000 | INHALATION_SPRAY | Freq: Four times a day (QID) | RESPIRATORY_TRACT | Status: DC | PRN

## 2014-04-13 NOTE — Telephone Encounter (Signed)
Spoke with pt. Advised her that she would need OV with MW for further refills. Enough medication has been sent in to last pt until OV with MW on 06/01/14. Nothing further was needed.

## 2014-06-01 ENCOUNTER — Ambulatory Visit (INDEPENDENT_AMBULATORY_CARE_PROVIDER_SITE_OTHER): Payer: BC Managed Care – PPO | Admitting: Internal Medicine

## 2014-06-01 ENCOUNTER — Encounter: Payer: Self-pay | Admitting: Internal Medicine

## 2014-06-01 VITALS — BP 92/60 | HR 56 | Temp 97.9°F | Ht 63.0 in | Wt 225.0 lb

## 2014-06-01 DIAGNOSIS — J45909 Unspecified asthma, uncomplicated: Secondary | ICD-10-CM | POA: Diagnosis not present

## 2014-06-01 DIAGNOSIS — J309 Allergic rhinitis, unspecified: Secondary | ICD-10-CM

## 2014-06-01 MED ORDER — ALBUTEROL SULFATE HFA 108 (90 BASE) MCG/ACT IN AERS: 2.0000 | INHALATION_SPRAY | RESPIRATORY_TRACT | Status: AC | PRN

## 2014-06-01 MED ORDER — BUDESONIDE-FORMOTEROL FUMARATE 160-4.5 MCG/ACT IN AERO: 2.0000 | INHALATION_SPRAY | Freq: Two times a day (BID) | RESPIRATORY_TRACT | Status: AC

## 2014-06-01 MED ORDER — CETIRIZINE-PSEUDOEPHEDRINE ER 5-120 MG PO TB12: 1.0000 | ORAL_TABLET | Freq: Two times a day (BID) | ORAL | Status: AC

## 2014-06-01 NOTE — Assessment & Plan Note (Signed)
-  required mech vent x 2, most recent 02/2012  - hfa 90% 10/25/2012   I had an extended discussion with the patient reviewing all relevant studies completed to date and  lasting 15 to 20 minutes of a 25 minute visit on the following ongoing concerns:  1)  All goals of chronic asthma control met including optimal function and elimination of symptoms with minimal need for rescue therapy.  Contingencies discussed in full including contacting this office immediately if not controlling the symptoms using the rule of two's.      2) no need to pretreat before ex with saba since supposed to be taking lama/ics daily   3)  Each maintenance medication was reviewed in detail including most importantly the difference between maintenance and as needed and under what circumstances the prns are to be used.  Please see instructions for details which were reviewed in writing and the patient given a copy.   

## 2014-06-01 NOTE — Patient Instructions (Signed)
No change in meds   When nose stuffy change to zyrtec d one twice daily as needed  Only use your albuterol as a rescue medication to be used if you can't catch your breath by resting or doing a relaxed purse lip breathing pattern.  - The less you use it, the better it will work when you need it. - Ok to use up to 2 puffs  every 4 hours if you must but call for immediate appointment if use goes up over your usual need - Don't leave home without it !!  (think of it like the spare tire for your car)   Please schedule a follow up visit in 6 months but call sooner if needed

## 2014-06-01 NOTE — Progress Notes (Signed)
Subjective:     Patient ID: Tonna CornerLeanna Majid, female   DOB: 07-02-1990  MRN: [id number]    Brief patient profile:  22 yowf never smoker [employer]  psychology/communications  major  asthma all her life previous allergy evaluation in [place] age 24 "everything" rec xolair but never started it maintained on advair and singulair but variably dependent on albuterol resulting in admit:   History of Present Illness   Admit date: 02/14/2012  Discharge date: 02/23/2012  Problem List  Principal Problem:  *Status asthmaticus  Active Problems:  Acute respiratory failure with hypercapnia  Acute encephalopathy, hypoxic and hypercarbic  Panic attacks, history of  Anisocoria, resolved  Steroid-induced hyperglycemia  ? Hypercarbic cerebral edema  Tension pneumothorax, spontaneous  Pulmonary edema  HPI:  [age] F [name] [facility] with severe respiratory distress and intubated for severe hypercarbia due to status asthmaticus  Hospital Course:  SIGNIFICANT EVENTS/STUDIES:  12/04 Very difficult to ventilate - Nimbex, propofol  12/04 Anisocoria noted  12/04 CT head: Question cerebral edema  12/04 hypertonic saline protocol initiated  12/04 Neuro Consult - 3% NaCl, MRI ordered (too unstable to travel), EEG ordered  12/04 Episode of severe hyperkalemia with widening complex >> responded to Ca2+, HCO3  12/04 increased bilateral PTX - bilateral anterior chest tubes placed with resolution  12/04 FOB - extensive mucus plugging  12/05 Much improved vent mechanics. Nimbex and propofol continued  12/05 No air leak on any of the chest tube chambers  12/05 Anisocoria resolved - hypertonic saline discontinued  12/05 HCO3 gtt discontinued  12/05 EEG: general background slowing. No epileptiform activity was noted. Findings consistent with current sedation.  12/06: NMBs D/C'd  12/07 tolerates PS 10 - 14  12/08 Extubated  LINES/TUBES:  R radial A line  12/04 >> 12/05  L anterior chest tube 12/04 >> 12/07  R anterior chest tube 12/04 >> 12/07  ETT 12/04 >> 12/08  R [place] CVL 12/04 >>  L radial A-line 12/05 >> out  L chest tube 12/04 >> 12-10  R chest tube 12/04 >> 12-11  MICRO:  MRSA PCR 12/04 >> NEG  Urine 12/05 >> NEG  Resp 12/05 >> NOF  Blood 12/05 >> neg  ABX:  Ceftrx 12/04 X 1 dose  PROPHYLAXIS:  DVT: SQ heparin  SUP: H2RB > D/C'd after extubation  CONSULTANTS:  Neuro [name]([place]) 12/04 >>   IMPRESSION:  Principal Problem:  *Status asthmaticus - resolved  Active Problems:  Pulmonary edema vs mild ALI, resolved  Acute respiratory failure resolved  Acute encephalopathy, hypoxic and hypercarbic, resolved  Anisocoria, resolved 12/05  ? Hypercarbic cerebral edema. resolved  Tension pneumothorax, spontaneous,resolved all chest tubes x 4 out.  Hyperkalemia, resolved  Steroid-induced hyperglycemia, resolving  Panic attacks, history of, no change  PLAN:  On floor  right sided chest tube out 12-11  DC PRN morphine.  Changed solumedrol to prednisone 40 mg PO daily with taper over the next two weeks.  Changed MDIs to nebulized.  Cont Empiric SSI in setting of systemic steroids - begin diet.  Mother and patient has been updated in detail bedside.Plan to dc 12-13 early  Removed TLC.  [place].    04/01/2012 f/u ov/Imaan Padgett cc 1st pulmonary eval back to nl x still using albuterol before exercise and comes in today with ventolin on zero. rec Plan A  Symbicort 160 Take 2 puffs first thing in am  and then another 2 puffs about 12 hours later and singulair also  Daily without fail PLAN B  Is your action plan (only used in addition to Plan A) Only use your albuterol (xopenex or combivent but not both at the same time) as a rescue medication to be used if you can't catch your breath by resting or doing a relaxed purse lip breathing pattern. The less you use it, the better it will work when you need it.  The goal is less than twice weekly. For headaches try midrin every 4 hours   10/25/2012 f/u ov/Graclyn Lawther re asthma with h/o status now maintained on symbicort 160 2bid singulair zyrtec Chief Complaint  Patient presents with  . Follow-up    Pt states doing well and denies any co's today.   no need at all rescue on ventolin (though note count is 120 and was zero last ov) rec No change rx   01/28/2013 f/u ov/Jeremias Broyhill re: asthma Chief Complaint  Patient presents with  . Follow-up    Pt states her breathing is doing well. No need for rescue inhaler in the past wk.    Min nasal congestion/ sore throat in am s purulent secretions. Not limited from desired activity by sob rec If any respiratory symptoms flare, try first adding pepcid ac 20 mg at bedtime GERD (REFLUX)   11/07/2013 f/u ov/Jaxsun Ciampi re: chronic asthma on symbicort 2 bid / singulair/zyrtec Chief Complaint  Patient presents with  . Follow-up    Pt c/o sinus congestion and pressure for the past 2 days. She also c/o prod cough with moderate clear sputum.  Not limited by breathing from desired activities  / no need for any rescue saba  rec Prednisone 10 mg take  4 each am x 2 days,   2 each am x 2 days,  1 each am x 2 days and stop  When nose is stuffy should use either zyrtec d or clariton d or allegra d  If any respiratory symptoms flare, try first adding pepcid ac 20 mg at bedtime GERD diet Please schedule a follow up visit in 3 months   06/01/2014 f/u ov/Linette Gunderson re: chronic asthma on symbicort 160 2bid / singulair/ uses saba before ex  Chief Complaint  Patient presents with  . Follow-up    Pt here for f/u asthma. Pt states asthma is well controlled. Pt needs refills today on asthma meds.   No obvious daytime variabilty or assoc chronic cough or cp or chest tightness, subjective wheeze overt hb symptoms. No unusual exp hx .    Sleeping ok without nocturnal  or early am exacerbation  of respiratory  c/o's or need for noct saba. Also  denies any obvious fluctuation of symptoms with weather or environmental changes or other aggravating or alleviating factors except as outlined above  ROS  The following are not active complaints unless bolded sore throat, dysphagia, dental problems, itching, sneezing,  nasal congestion or excess/ purulent secretions, ear ache,   fever, chills, sweats, unintended wt loss, pleuritic or exertional cp, hemoptysis,  orthopnea pnd or leg swelling, presyncope, palpitations, heartburn, abdominal pain, anorexia, nausea, vomiting, diarrhea  or change in bowel or urinary habits, change in stools or urine, dysuria,hematuria,  rash, arthralgias, visual complaints, headache    numbness weakness or ataxia or problems with walking or coordination,  change in mood/affect or memory.             Objective:   Physical Exam  Pleasant amb wf nad, all  smiles    01/28/2013     234 > 11/07/2013  226 > 06/01/2014   225  Wt Readings from Last 3 Encounters:  10/25/12 233 lb 6.4 oz (105.87 kg)  04/01/12 218 lb (98.884 kg)  02/21/12 220 lb 14.4 oz (100.2 kg)        HEENT: nl dentition,   and orophanx which is pristine/ mod non-speicific turbinate edema with clear secretions. Nl external ear canals without cough reflex   NECK :  without JVD/Nodes/TM/ nl carotid upstrokes bilaterally   LUNGS: no acc muscle use, clear to A and P bilaterally without cough on insp or exp maneuvers   CV:  RRR  no s3 or murmur or increase in P2, no edema   ABD:  soft and nontender with nl excursion in the supine position. No bruits or organomegaly, bowel sounds nl  MS:  warm without deformities, calf tenderness, cyanosis or clubbing         Assessment:

## 2014-06-01 NOTE — Assessment & Plan Note (Signed)
Perennial symptoms > rx zyrtec otc prn itching sneezing runny nose  with zyrtec d one bid prn flare of obstructive syptoms

## 2014-06-26 ENCOUNTER — Telehealth: Payer: Self-pay | Admitting: Internal Medicine

## 2014-06-26 MED ORDER — PREDNISONE 10 MG PO TABS: ORAL_TABLET | ORAL | Status: DC

## 2014-06-26 NOTE — Telephone Encounter (Signed)
Offer add on now and if refuses  Prednisone 10 mg take  4 each am x 2 days,   2 each am x 2 days,  1 each am x 2 days and stop then ov at 2 weeks Tammy NP or me

## 2014-06-26 NOTE — Telephone Encounter (Signed)
Spoke with pt, states she works at 5 and cannot come in today.  Scheduled her for 4/27 with TP to follow up, prednisone called in.  Nothing further needed.

## 2014-06-26 NOTE — Telephone Encounter (Signed)
Spoke with pt, c/o chest tightness, prod cough with thick dark yellow mucus, sinus congestion, headache X2 days.  Pt [facility], said she wasn't wheezing and did nothing for her.  Denies fever, chest pain.   Pt has been taking zyrtec D daily, flonase.   Pt uses [facility] on [address].    Dr. Sherene SiresWert please advise.  Thanks!

## 2014-07-08 ENCOUNTER — Encounter: Payer: Self-pay | Admitting: Adult Health

## 2014-07-08 ENCOUNTER — Ambulatory Visit (INDEPENDENT_AMBULATORY_CARE_PROVIDER_SITE_OTHER): Payer: BC Managed Care – PPO | Admitting: Adult Health

## 2014-07-08 VITALS — BP 102/50 | HR 71 | Temp 98.3°F | Ht 64.0 in | Wt 226.3 lb

## 2014-07-08 DIAGNOSIS — J45909 Unspecified asthma, uncomplicated: Secondary | ICD-10-CM | POA: Diagnosis not present

## 2014-07-08 NOTE — Assessment & Plan Note (Signed)
Asthma exacerbation , upper respiratory infection , now resolved Patient's continue on her current regimen Follow up in our office in 6 months and as needed

## 2014-07-08 NOTE — Progress Notes (Signed)
Chart/ notes reviewed/ agree with ov a/p

## 2014-07-08 NOTE — Patient Instructions (Signed)
Continue on current regimen .  Only use your albuterol as a rescue medication to be used if you can't catch your breath by resting or doing a relaxed purse lip breathing pattern.  - The less you use it, the better it will work when you need it. - Ok to use up to 2 puffs  every 4 hours if you must but call for immediate appointment if use goes up over your usual need - Don't leave home without it !!  (think of it like the spare tire for your car)   Please schedule a follow up visit in 6 months but call sooner if needed

## 2014-07-08 NOTE — Progress Notes (Signed)
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 Hypercarbic cerebral edema  Tension pneumothorax, spontaneous  Pulmonary edema  HPI:  38 F [name] presented to [facility] ED via EMS with severe respiratory distress and intubated for severe hypercarbia due to status asthmaticus  Hospital Course:  SIGNIFICANT EVENTS/STUDIES:  12/04 Very difficult to ventilate - Nimbex, propofol  12/04 Anisocoria noted  12/04 CT head: Question cerebral edema  12/04 hypertonic saline protocol initiated  12/04 Neuro Consult - 3% NaCl, MRI ordered (too unstable to travel), EEG ordered  12/04 Episode of severe hyperkalemia with widening complex >> responded to Ca2+, HCO3  12/04 increased bilateral PTX - bilateral anterior chest tubes placed with resolution  12/04 FOB - extensive mucus plugging  12/05 Much improved vent mechanics. Nimbex and propofol continued  12/05 No air leak on any of the chest tube chambers  12/05 Anisocoria resolved - hypertonic saline discontinued  12/05 HCO3 gtt discontinued  12/05 EEG: general background slowing. No epileptiform activity was noted. Findings consistent with current sedation.  12/06: NMBs D/C'd  12/07 tolerates PS 10 - 14  12/08 Extubated  LINES/TUBES:  R radial A line  12/04 >> 12/05  L anterior chest tube 12/04 >> 12/07  R anterior chest tube 12/04 >> 12/07  ETT 12/04 >> 12/08  R [place] CVL 12/04 >>  L radial A-line 12/05 >> out  L chest tube 12/04 >> 12-10  R chest tube 12/04 >> 12-11  MICRO:  MRSA PCR 12/04 >> NEG  Urine 12/05 >> NEG  Resp 12/05 >> NOF  Blood 12/05 >> neg  ABX:  Ceftrx 12/04 X 1 dose  PROPHYLAXIS:  DVT: SQ heparin  SUP: H2RB > D/C'd after extubation  CONSULTANTS:  Neuro [name]) 12/04 >>   IMPRESSION:  Principal Problem:  *Status asthmaticus - resolved  Active Problems:  Pulmonary edema vs mild ALI, resolved  Acute respiratory failure resolved  Acute encephalopathy, hypoxic and hypercarbic, resolved  Anisocoria, resolved 12/05  ? Hypercarbic cerebral edema. resolved  Tension pneumothorax, spontaneous,resolved all chest tubes x 4 out.  Hyperkalemia, resolved  Steroid-induced hyperglycemia, resolving  Panic attacks, history of, no change  PLAN:  On floor  right sided chest tube out 12-11  DC PRN morphine.  Changed solumedrol to prednisone 40 mg PO daily with taper over the next two weeks.  Changed MDIs to nebulized.  Cont Empiric SSI in setting of systemic steroids - begin diet.  Mother and patient has been updated in detail bedside.Plan to dc 12-13 early  Removed TLC.  Follow up with pul MD in [place] and [place].    04/01/2012 f/u ov/Wert cc 1st pulmonary eval back to nl x still using albuterol before exercise and comes in today with ventolin on zero. rec Plan A  Symbicort 160 Take 2 puffs first thing in am  and then another 2 puffs about 12 hours later and singulair also  Daily without fail PLAN B  Is your action plan (only used in addition to Plan A) Only use your albuterol (xopenex or combivent but not both at the same time) as a rescue medication to be used if you can't catch your breath by resting or doing a relaxed purse lip breathing pattern. The less you use it, the better it will work when you need it.  The goal is less than twice weekly. For headaches try midrin every 4 hours   10/25/2012 f/u ov/Wert re asthma with h/o status now maintained on symbicort 160 2bid singulair zyrtec Chief Complaint  Patient presents with  . Follow-up    Pt states doing well and denies any co's today.   no need at all rescue on ventolin (though note count is 120 and was zero last ov) rec No change rx   01/28/2013 f/u ov/Wert re: asthma Chief Complaint  Patient presents with  . Follow-up    Pt states her breathing is doing well. No need for rescue inhaler in the past wk.    Min nasal congestion/ sore throat in am s purulent secretions. Not limited from desired activity by sob rec If any respiratory symptoms flare, try first adding pepcid ac 20 mg at bedtime GERD (REFLUX)   11/07/2013 f/u ov/Wert re: chronic asthma on symbicort 2 bid / singulair/zyrtec Chief Complaint  Patient presents with  . Follow-up    Pt c/o sinus congestion and pressure for the past 2 days. She also c/o prod cough with moderate clear sputum.  Not limited by breathing from desired activities  / no need for any rescue saba  rec Prednisone 10 mg take  4 each am x 2 days,   2 each am x 2 days,  1 each am x 2 days and stop  When nose is stuffy should use either zyrtec d or clariton d or allegra d  If any respiratory symptoms flare, try first adding pepcid ac 20 mg at bedtime GERD diet Please schedule a follow up visit in 3 months   06/01/2014 f/u ov/Wert re: chronic asthma on symbicort 160 2bid / singulair/ uses saba before ex  Chief Complaint  Patient presents with  . Follow-up    Pt here for f/u asthma. Pt states asthma is well controlled. Pt needs refills today on asthma meds.  >>no changes   07/08/2014 Follow up : Chronic asthma  Patient presents for a two-week follow-up. Patient says that she developed cough, congestion and tightness 2 weeks ago. Felt to have an upper respiratory infection. She was called in a prednisone  taper. Patient says that she is much improved. Cough has decreased. He only has some lingering clear congestion. He denies any chest pain, wheezing, hemoptysis, orthopnea, PND or leg swelling. No increased Saba use. Patient is graduating from college next week.     ROS  The following are not active complaints unless bolded sore throat, dysphagia, dental problems, itching, sneezing,  nasal congestion or excess/ purulent secretions, ear ache,   fever, chills, sweats, unintended wt loss, pleuritic or exertional cp, hemoptysis,  orthopnea pnd or leg swelling, presyncope, palpitations, heartburn, abdominal pain, anorexia, nausea, vomiting, diarrhea  or change in bowel or urinary habits, change in stools or urine, dysuria,hematuria,  rash, arthralgias, visual complaints, headache    numbness weakness or ataxia or problems with walking or coordination,  change in mood/affect or memory.  Objective:   Physical Exam  Pleasant amb wf nad, all smiles    01/28/2013     234 > 11/07/2013  226 > 06/01/2014   225>226 07/08/2014       HEENT: nl dentition,   and orophanx which is pristine/ mod non-speicific turbinate edema with clear secretions. Nl external ear canals without cough reflex   NECK :  without JVD/Nodes/TM/ nl carotid upstrokes bilaterally   LUNGS: no acc muscle use, clear to A and P bilaterally without cough on insp or exp maneuvers   CV:  RRR  no s3 or murmur or increase in P2, no edema   ABD:  soft and nontender with nl excursion in the supine position. No bruits or organomegaly, bowel sounds nl  MS:  warm without deformities, calf tenderness, cyanosis or clubbing         Assessment:

## 2014-08-20 IMAGING — CR DG CHEST 1V PORT
1 series · 1 of 1 positions shown · non-contrast
Comparison: 02/16/2012

CLINICAL DATA: Respiratory failure.

PORTABLE CHEST - 1 VIEW

[AP]
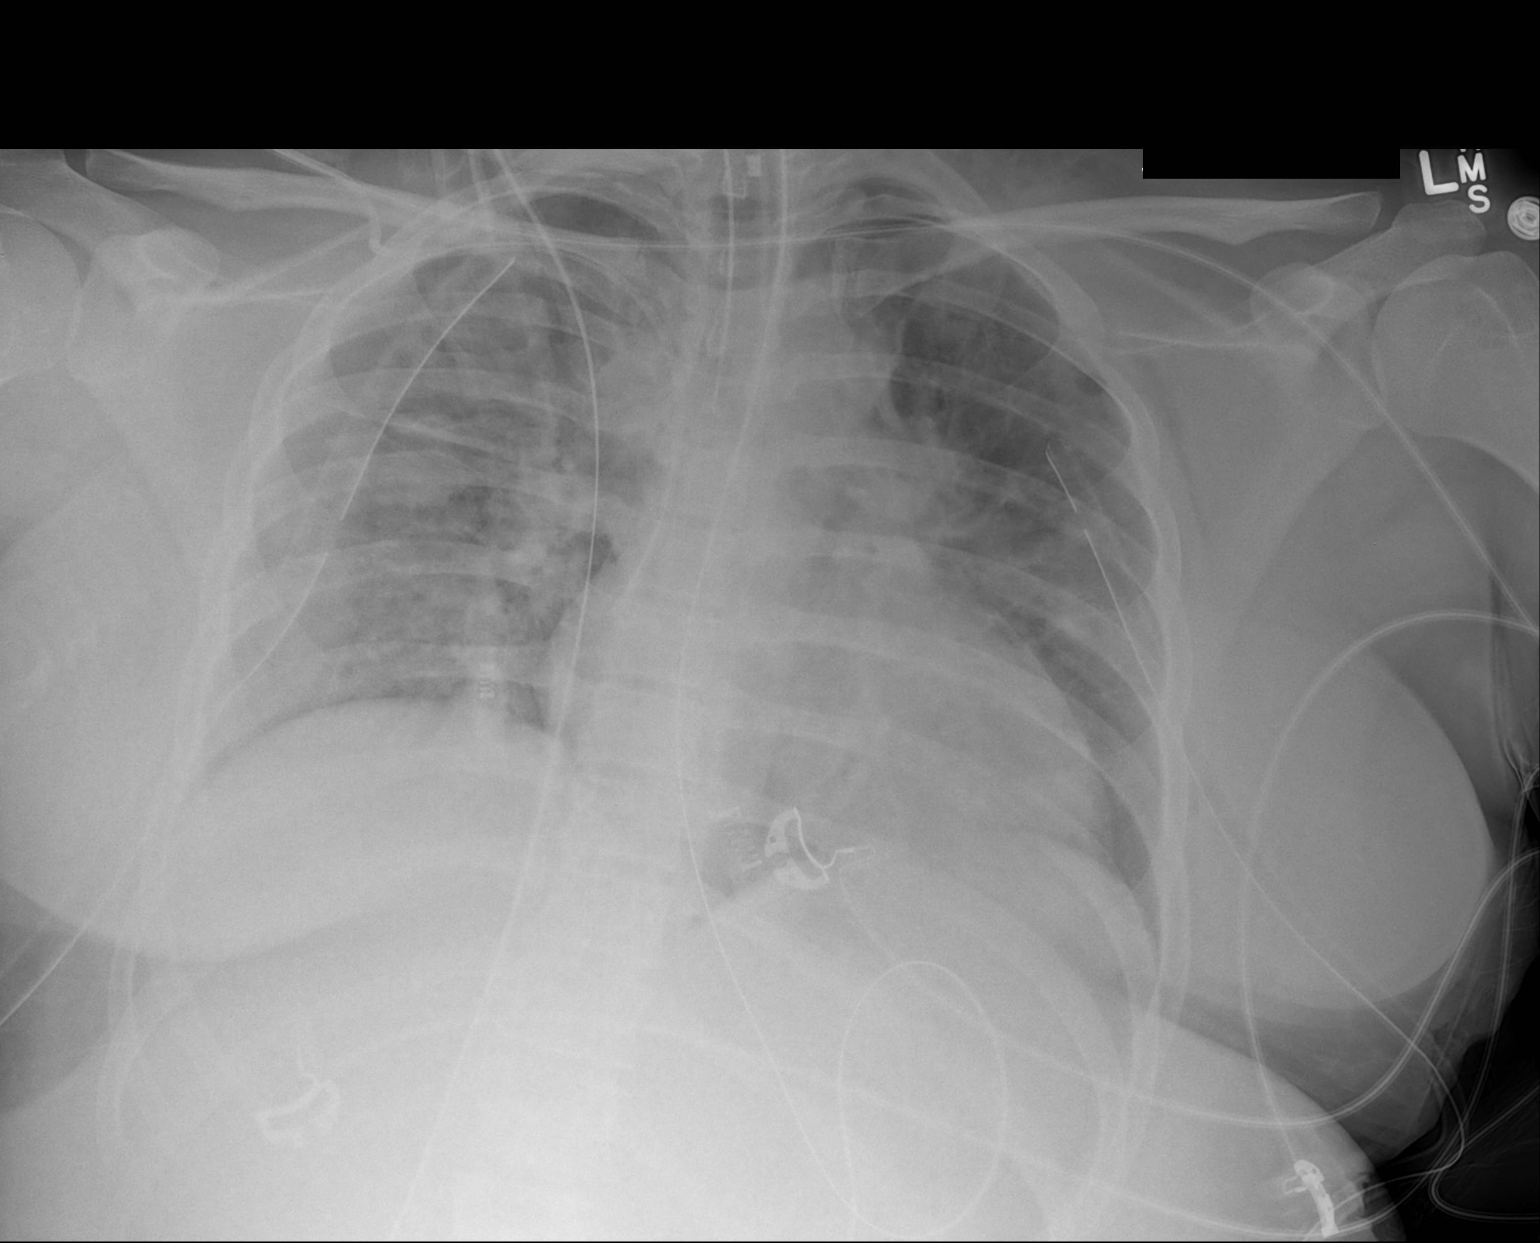

[1 of 1 positions shown; findings below may reference images not displayed]

FINDINGS: Support devices are unchanged.  Bilateral chest tubes.
No visible pneumothorax.  Low lung volumes with bilateral airspace
opacities, unchanged.  No visible effusion.
IMPRESSION: No significant change.  No pneumothorax.

## 2015-01-08 ENCOUNTER — Ambulatory Visit: Payer: BC Managed Care – PPO | Admitting: Adult Health

## 2015-01-25 ENCOUNTER — Telehealth: Payer: Self-pay | Admitting: Internal Medicine

## 2015-01-25 NOTE — Telephone Encounter (Signed)
lmtcb x1 

## 2015-01-25 NOTE — Telephone Encounter (Signed)
Only thing to add if condition worsens is Prednisone 10 mg take  4 each am x 2 days,   2 each am x 2 days,  1 each am x 2 days and stop

## 2015-01-25 NOTE — Telephone Encounter (Signed)
Called and spoke to pt. Pt stated [place] is [place] away from [place] in [place]. Pt stated the ambient air is smoky and hazy. Pt stated she tries to avoid going outside but she works. Pt stated when she walks outside her throat gets scratchy and her eyes water. Pt denies any asthma flare up s/s, denies SOB, cough, mucus production, chest tightness. Advised pt to avoid the smoke when she can and to wear a mask when she is outside. Pt requesting recs from Dr. Sherene SiresWert.   Dr. Sherene SiresWert please advise. Thanks.

## 2015-01-26 MED ORDER — PREDNISONE 10 MG PO TABS: ORAL_TABLET | ORAL | Status: AC

## 2015-01-26 NOTE — Telephone Encounter (Signed)
Called and spoke to pt. Informed her of the recs per MW. Pt requested we send in the rx now for pt to hold on to in case her s/s worsen. Rx sent to preferred pharamacy. Pt verbalized understanding and denied any further questions or concerns at this time.

## 2015-05-25 ENCOUNTER — Telehealth: Payer: Self-pay | Admitting: Internal Medicine

## 2015-05-25 NOTE — Telephone Encounter (Signed)
Patient has moved to [place], she gets her prescriptions from Dr. Sherene SiresWert.  She tried to get the prescription refilled in [place], but they stated she needed a PA to cover the prescription. Pharmacy [place] [facility] [place] Patient doesn't know what to do because she needs her medication ASAP.  I asked her if she has a doctor there yet and she said that she has an appointment next month, but she has not established yet.  Called [facility] in [place], [place]. They advised me that they faxed PA request to [facility] fax number.   Asked them to fax the PA request to our fax number in [place].  Misty - have you received this fax? This needs to be processed ASAP. Please advise.

## 2015-05-25 NOTE — Telephone Encounter (Signed)
I haven't received a PA on this pt.

## 2015-05-25 NOTE — Telephone Encounter (Signed)
We have the letter i will send to [facility] and pt called her ins they say she needs a letter for medical nessi   Faxed [phone] and say urgent

## 2015-05-25 NOTE — Telephone Encounter (Signed)
Awaiting fax from [facility] [place] Will Process PA when received.

## 2015-05-25 NOTE — Telephone Encounter (Signed)
Misty did you receive fax from [place] regarding this patient?

## 2015-05-26 NOTE — Telephone Encounter (Signed)
I still don't have anything on this pt.

## 2015-05-27 MED ORDER — MOMETASONE FURO-FORMOTEROL FUM 200-5 MCG/ACT IN AERO: 2.0000 | INHALATION_SPRAY | Freq: Two times a day (BID) | RESPIRATORY_TRACT | Status: AC

## 2015-05-27 NOTE — Telephone Encounter (Signed)
[facility] Sent [facility] through [facility]. Came back saying that PA is not needed for this medication.  Called [facility] pharmacy in [place], [place], they said that it does need PA or we could change medication to preferred drug:  Advair, Breo, or Dulera  Patient has tried Advair in the past, but do not see that she has been on [place] or Dulera.  Dr. Sherene SiresWert, please advise.

## 2015-05-27 NOTE — Telephone Encounter (Signed)
Ok to use dulera but as of next month it will have been a year since seen here and can't refill s being seen so dulera 200 x3 m supply only and needs to establish there

## 2015-05-27 NOTE — Telephone Encounter (Signed)
Called and spoke with pt. Informed her of MW's recs. Verifed pharmacy as listed below. She voiced understanding and had no further questions. Rx sent. Nothing further needed.
